# Patient Record
Sex: Female | Born: 1975 | Race: White | Hispanic: No | Marital: Married
Health system: Midwestern US, Academic
[De-identification: ages and names within clinical notes are randomized; demographics above are authoritative.]

## PROBLEM LIST (undated history)

## (undated) DIAGNOSIS — J340 Abscess, furuncle and carbuncle of nose: Secondary | ICD-10-CM

## (undated) DIAGNOSIS — R109 Unspecified abdominal pain: Secondary | ICD-10-CM

## (undated) DIAGNOSIS — H109 Unspecified conjunctivitis: Secondary | ICD-10-CM

## (undated) DIAGNOSIS — R011 Cardiac murmur, unspecified: Secondary | ICD-10-CM

## (undated) DIAGNOSIS — K429 Umbilical hernia without obstruction or gangrene: Secondary | ICD-10-CM

## (undated) DIAGNOSIS — M4310 Spondylolisthesis, site unspecified: Secondary | ICD-10-CM

## (undated) DIAGNOSIS — I499 Cardiac arrhythmia, unspecified: Secondary | ICD-10-CM

## (undated) DIAGNOSIS — R1110 Vomiting, unspecified: Secondary | ICD-10-CM

## (undated) DIAGNOSIS — R110 Nausea: Secondary | ICD-10-CM

## (undated) DIAGNOSIS — B373 Candidiasis of vulva and vagina: Secondary | ICD-10-CM

## (undated) DIAGNOSIS — M5430 Sciatica, unspecified side: Secondary | ICD-10-CM

## (undated) DIAGNOSIS — N390 Urinary tract infection, site not specified: Secondary | ICD-10-CM

## (undated) DIAGNOSIS — R059 Cough, unspecified: Secondary | ICD-10-CM

## (undated) DIAGNOSIS — L309 Dermatitis, unspecified: Secondary | ICD-10-CM

## (undated) DIAGNOSIS — F32A Depression, unspecified: Secondary | ICD-10-CM

## (undated) DIAGNOSIS — J029 Acute pharyngitis, unspecified: Secondary | ICD-10-CM

## (undated) DIAGNOSIS — F411 Generalized anxiety disorder: Secondary | ICD-10-CM

## (undated) DIAGNOSIS — F410 Panic disorder [episodic paroxysmal anxiety] without agoraphobia: Secondary | ICD-10-CM

## (undated) DIAGNOSIS — J329 Chronic sinusitis, unspecified: Secondary | ICD-10-CM

## (undated) DIAGNOSIS — J1282 Pneumonia due to coronavirus disease 2019: Secondary | ICD-10-CM

## (undated) DIAGNOSIS — H6090 Unspecified otitis externa, unspecified ear: Secondary | ICD-10-CM

## (undated) DIAGNOSIS — E039 Hypothyroidism, unspecified: Secondary | ICD-10-CM

## (undated) DIAGNOSIS — U071 COVID-19: Secondary | ICD-10-CM

## (undated) DIAGNOSIS — E271 Primary adrenocortical insufficiency: Secondary | ICD-10-CM

## (undated) DIAGNOSIS — H6690 Otitis media, unspecified, unspecified ear: Secondary | ICD-10-CM

## (undated) DIAGNOSIS — B3731 Acute candidiasis of vulva and vagina: Secondary | ICD-10-CM

## (undated) DIAGNOSIS — M6208 Separation of muscle (nontraumatic), other site: Secondary | ICD-10-CM

## (undated) DIAGNOSIS — G43909 Migraine, unspecified, not intractable, without status migrainosus: Secondary | ICD-10-CM

## (undated) HISTORY — DX: Sciatica, unspecified side: M54.30

## (undated) HISTORY — DX: Candidiasis of vulva and vagina: B37.3

## (undated) HISTORY — DX: Dermatitis, unspecified: L30.9

## (undated) HISTORY — DX: Urinary tract infection, site not specified: N39.0

## (undated) HISTORY — DX: Unspecified conjunctivitis: H10.9

## (undated) HISTORY — DX: Cardiac murmur, unspecified: R01.1

## (undated) HISTORY — DX: COVID-19: U07.1

## (undated) HISTORY — DX: Spondylolisthesis, site unspecified: M43.10

## (undated) HISTORY — DX: Pneumonia due to coronavirus disease 2019: J12.82

## (undated) HISTORY — DX: Depression, unspecified: F32.A

## (undated) HISTORY — DX: Nausea: R11.0

## (undated) HISTORY — DX: Otitis media, unspecified, unspecified ear: H66.90

## (undated) HISTORY — DX: Cardiac arrhythmia, unspecified: I49.9

## (undated) HISTORY — DX: Umbilical hernia without obstruction or gangrene: K42.9

## (undated) HISTORY — DX: Panic disorder (episodic paroxysmal anxiety): F41.0

## (undated) HISTORY — DX: Chronic sinusitis, unspecified: J32.9

## (undated) HISTORY — DX: Acute pharyngitis, unspecified: J02.9

## (undated) HISTORY — DX: Vomiting, unspecified: R11.10

## (undated) HISTORY — DX: Hypothyroidism, unspecified: E03.9

## (undated) HISTORY — DX: Migraine, unspecified, not intractable, without status migrainosus: G43.909

## (undated) HISTORY — DX: Acute candidiasis of vulva and vagina: B37.31

## (undated) HISTORY — DX: Primary adrenocortical insufficiency: E27.1

## (undated) HISTORY — DX: Unspecified abdominal pain: R10.9

## (undated) HISTORY — DX: Generalized anxiety disorder: F41.1

## (undated) HISTORY — DX: Separation of muscle (nontraumatic), other site: M62.08

## (undated) HISTORY — DX: Unspecified otitis externa, unspecified ear: H60.90

## (undated) HISTORY — DX: Abscess, furuncle and carbuncle of nose: J34.0

## (undated) HISTORY — DX: Cough, unspecified: R05.9

## (undated) LAB — EKG 12-LEAD
Atrial Rate: 96 {beats}/min
P Axis: 78 degrees
P-R Interval: 132 ms
Q-T Interval: 374 ms
QRS Duration: 84 ms
QTc Calculation (Bazett): 472 ms
R Axis: 86 degrees
T Axis: 67 degrees
Ventricular Rate: 96 {beats}/min

## (undated) NOTE — Telephone Encounter (Signed)
 Formatting of this note might be different from the original. Sent paper referral from [place] office  to [address] for Dr Tollie to review and sched for npv migraines.  Electronically signed by Jani Lapine at 02/14/2016 11:50 AM EST

## (undated) NOTE — Telephone Encounter (Signed)
 Formatting of this note might be different from the original. Referral:  Brittany Rogers's husband [name] is calling the office today and may be reached at:  [phone]  Requesting an appointment to neurology. Was referred to [facility] but now looking at [facility] because [facility] does not treat migraines.  Reason (Diagnosis/Symptom) for Appointment: poss seizure (?) & migraine Spouse is concerned that migraine medication might be causing the seizure-like symptoms Current Insurance:  [facility]  Patient has been advised to Fax the Referral to the Back Office Staff at:  [phone] (fax)  Once the Faxed Referral has been received, the patient is requesting a return call to Schedule.  Electronically signed by Grayce Maxwell at 02/10/2016  3:06 PM EST

## (undated) NOTE — Anesthesia Preprocedure Evaluation (Signed)
 Formatting of this note is different from the original. ANESTHESIA EVALUATION  I have reviewed the patient summary  and existing labs  .  (+) History of anesthetic complications (severe PONV): PONV . (-) No family history of anesthetic complications .  Review of Systems and Medical History:  CNS/Musculoskeletal:   (+)  Headaches: migraines. Psychiatric history: Anxiety . GI/Hepatic/Renal: negative ROS   Cardiovascular: negative ROS (+) Exercise tolerance: 7-9 Mets Endocrine/Other:  (+) thyroid  problem Hypothyroidism.   Respiratory:  neg pulmonary ROS Substance Use/Abuse:  negative ROS   Obstetrical Hx:     Obstetrics:        Non-Obstetrical Hx:       Airway: Mallampati: I TM distance: >3 FB Neck ROM: full    Dental: - no notable dental history .    Anesthesia Plan:  ASA: 2   Plan: General    Induction: Intravenous  NPO status verified Patient is appropriate for LMA Consent: - Anesthesia discussed major risks, alternatives, benefits, postop recovery process and destination.  Anesthesia plan understood and accepted.  All questions answered. The following person(s) agreed to procedure: patient      Electronically signed by Dasie Margit POUR, Registered Nurse at 12/14/2018  1:27 PM EST Electronically signed by Limmie Redell BIRCH., MD at 12/23/2018 12:25 PM EST Electronically signed by Limmie Redell BIRCH., MD at 12/23/2018  1:10 PM EST

## (undated) NOTE — Anesthesia Postprocedure Evaluation (Signed)
 Formatting of this note is different from the original. Anesthesia Post-op Note  Patient Name:  Brittany Rogers    Procedure(s): BILATERAL SALINE BREAST IMPLANT EXCHANGE TO SALINE IMPLANTS  Patient location: Phase II  I have personally evaluated the recovery of this patient from anesthesia:  Yes  Vital Signs:   Vitals:   12/23/18 1530  BP: 95/79   Vitals:   12/23/18 1530  Pulse: 62   Vitals:   12/23/18 1530  Temp: 37.1 C   Vitals:   12/23/18 1530  Resp: 19   Vitals:   12/23/18 1530  SpO2: 100%   Airway Patency: Excellent  Mental Status:  Alert and oriented  Pain Status:  Controlled  Nausea: None  Vomiting:  No  Hydration Status:  adequate    Perioperative Outcomes:  None   Additional Comments:    Electronically signed by Limmie Redell BIRCH., MD at 12/23/2018  3:37 PM EST

## (undated) NOTE — CAA (Care Area Assessment) (Signed)
 Formatting of this note is different from the original. Anesthesia OR Handoff Note  Patient Name:  Brittany Rogers    Procedure(s): BILATERAL SALINE BREAST IMPLANT EXCHANGE TO SALINE IMPLANTS  Pre-op diagnosis: Encounter for cosmetic procedure [Z41.1]  Surgeon/Service: Missy Katrine DASEN., MD, Plastics  Patient location: PACU  Anesthesia type: General  Medical history:  Past Medical History:  Diagnosis Date  ? Hx of migraines    Intra-procedure management issues/concerns: Uneventful    Post-op pain: Adequate analgesia  Post-op assessment: no apparent anesthetic complications, tolerated procedure well and no evidence of recall  Vitals:  Vitals:   12/23/18 1136  BP: 108/60  Pulse: 59  Resp: 20  Temp:   SpO2: 100%   Heart rate, blood pressure, respiratory rate, and oxygen saturation have been recorded and reviewed on arrival to PACU. Refer to nursing documentation.  Vitals status: stable  Post-op Airway: Room Air  Level of consciousness: awake  Complications: none  Plans for early post-procedure period: Progress to discharge from PACU  After opportunity for questions, ICU/PACU team accepts care of patient.   Receiving RN name: Rosaline  Additional Comments:   Electronically signed by Limmie Redell BIRCH., MD at 12/23/2018  2:34 PM EST

---

## 2008-06-16 ENCOUNTER — Inpatient Hospital Stay
Admit: 2008-06-16 | Discharge: 2008-06-16 | Disposition: A | Discharge status: Home / Self Care | Attending: Emergency Medicine

## 2008-06-16 MED ORDER — HYDROMORPHONE HCL 2 MG/ML IJ SOLN
2 MG/ML | Freq: Once | INTRAMUSCULAR | Status: AC
Start: 2008-06-16 — End: 2008-06-16
  Administered 2008-06-16: 18:00:00 2 mg via INTRAMUSCULAR

## 2008-06-16 MED ORDER — PROMETHAZINE HCL 25 MG PO TABS
25 MG | ORAL_TABLET | Freq: Four times a day (QID) | ORAL | Status: AC | PRN
Start: 2008-06-16 — End: 2008-06-23

## 2008-06-16 MED ORDER — PROMETHAZINE HCL 25 MG/ML IJ SOLN
25 MG/ML | Freq: Once | INTRAMUSCULAR | Status: AC
Start: 2008-06-16 — End: 2008-06-16
  Administered 2008-06-16: 18:00:00 25 mg via INTRAMUSCULAR

## 2008-06-16 MED ORDER — SODIUM CHLORIDE 0.9 % IV SOLN
0.9 % | Freq: Once | INTRAVENOUS | Status: DC
Start: 2008-06-16 — End: 2008-06-16
  Administered 2008-06-16: 19:00:00 1000 mL via INTRAVENOUS

## 2008-06-16 MED ORDER — KETOROLAC TROMETHAMINE 30 MG/ML IJ SOLN
30 | INTRAMUSCULAR | Status: AC
Start: 2008-06-16 — End: ?

## 2008-06-16 MED ORDER — SODIUM CHLORIDE 0.9 % IV SOLN
0.9 | INTRAVENOUS | Status: AC
Start: 2008-06-16 — End: ?

## 2008-06-16 MED ORDER — PROMETHAZINE HCL 25 MG/ML IJ SOLN
25 | INTRAMUSCULAR | Status: AC
Start: 2008-06-16 — End: ?

## 2008-06-16 MED ORDER — HYDROMORPHONE HCL 2 MG/ML IJ SOLN
2 | INTRAMUSCULAR | Status: AC
Start: 2008-06-16 — End: ?

## 2008-06-16 MED ORDER — KETOROLAC TROMETHAMINE 30 MG/ML IJ SOLN
30 MG/ML | Freq: Once | INTRAMUSCULAR | Status: AC
Start: 2008-06-16 — End: 2008-06-16
  Administered 2008-06-16: 18:00:00 60 mg via INTRAMUSCULAR

## 2008-06-16 MED FILL — KETOROLAC TROMETHAMINE 30 MG/ML IJ SOLN: 30 MG/ML | INTRAMUSCULAR | Qty: 2

## 2008-06-16 MED FILL — PROMETHAZINE HCL 25 MG/ML IJ SOLN: 25 MG/ML | INTRAMUSCULAR | Qty: 1

## 2008-06-16 MED FILL — SODIUM CHLORIDE 0.9 % IV SOLN: 0.9 % | INTRAVENOUS | Qty: 1000

## 2008-06-16 MED FILL — HYDROMORPHONE HCL 2 MG/ML IJ SOLN: 2 MG/ML | INTRAMUSCULAR | Qty: 1

## 2008-06-16 NOTE — ED Notes (Signed)
Bed:08-01<BR> Expected date:<BR> Expected time:<BR> Means of arrival:<BR> Comments:<BR>

## 2008-06-16 NOTE — Discharge Instructions (Signed)
Acute Headache   WHAT YOU SHOULD KNOW:   A headache is pain or discomfort in the head. A headache may have a known cause, or the cause may be unknown. Many people get headaches. Both men and women can get headaches, but women more often have headaches that are moderately or very painful. Headaches occur most often in middle aged adults, and less often in older adults.  AFTER YOU LEAVE:   Common types of headaches:    Tension-type headache: This is the most common type of headache. These headaches may occur during the day, typically in the late afternoon, and may go away by the evening. The pain is usually mild or moderate, and you may have problems tolerating bright light or loud noise. The pain is usually felt across the forehead or in the back of the head, and it may be only on one side. These headaches may occur every day.      Migraine headache: Migraine headaches are usually recurring and cause moderate or severe pain. The headaches generally last from 1 to 3 days. Pain tends to be on only one side of the head, and may change sides. The pain is often located in the temple or the back of the head, and it may feel throbbing. The pain may also be behind your eye, and feel sharp and steady.     Migraine with aura: An aura is something that you see or feel, and occurs before a headache. People with an aura before their migraine headache often see a small spot surrounded by bright zigzag lines. Other signs or symptoms may follow the aura.      Cluster headache: The pain of a cluster headache is usually on one side of the head. It often causes severe pain, and can last for 30 minutes to two hours. These headaches may occur once or twice each day. These headaches occur more often at night, and may wake you from sleeping.   Causes: Headaches may be a symptom of a wide variety of conditions including the following:    Infections: This includes infections caused by a virus, bacteria or other germ.     Metabolic  disorders: This includes thyroid gland and other disorders.      Neoplasms: These are also called growths or tumors.     Posttraumatic disorders: After head trauma, such as a blow to the head, a headache may occur.     Primary headache disorders: These problems may be from muscle contractions or changes in the way that your body feels, controls, or responds to pain.     Structural disorders: These problems may involve nerves such as cranial nerves or the trigeminal nerve. Meninges, blood vessels, muscles, soft tissues, skin, bones, or joints may also cause headaches. Tooth, neck or sinus pain may also cause headaches.     Toxins: Being around strong-smelling chemicals or other substances may cause headaches.   Other factors that may cause or be related to headaches include:    Caffeine: If you are used to drinking 2 to 3 cups of coffee each day, suddenly stopping may cause a headache.      Diet: Cured meats, artificial sweeteners, products that contain tyramine (such as red wine, aged cheese, and dark chocolate), and monosodium glutamate (MSG) may trigger headaches.     Drinking alcohol: Drinks that contain alcohol such as beer, wine, vodka and other hard liquor may cause headaches.     Fasting: Not eating for a period of   time may cause a headache.      Fatigue: Tiredness can cause tension-type headaches to occur.      Menstruation cycles: Menstruation may cause headaches, especially for teenagers or after a pregnancy. Headaches are also more likely when you use oral contraceptives (birth control pills) or estrogen replacement therapy.     Sleep deprivation: Not getting enough sleep can cause tension-type headaches, and changes in your usual sleep pattern can cause a migraine headache. Napping during the daytime may also cause headaches.     Stress: Tension or stress may cause headaches. Migraine headaches caused by stress may go away after relaxing. Headaches caused by stress may occur hours or even  days after stressful events.   Telling caregivers about your headache:    A pain scale may be used to help you tell caregivers how bad the pain is.             Headaches may be acute, lasting for hours or days. Headaches may also be subacute, lasting for days or weeks. Headaches may also be chronic, lasting for months or years. Headaches can occur in one or more places, anywhere in the head.     Headaches may be mild or moderately painful. It may be hard to describe exactly where the pain is, and the headache may feel like pressure in or on your head. These headaches are usually caused by muscle problems such as contractions (cramps) or injuries.     Headaches may be moderately or very painful. You may be able to state exactly where the pain is. These headaches usually are behind your eye or in your forehead, and are a throbbing pain. This type of headache is usually caused by problems with blood vessels in the head.   Signs and symptoms that may be related to headaches:    Fever.     Loss of memory, responses to events that seem incorrect, and trouble solving easy mathematical questions.      Nausea (feeling sick to your stomach) and vomiting (throwing up).     Problems with your vision:     Drooping eyelids, watery eyes, or red eyes.      Losing your ability to see to the sides, up or down.     Seeing strange designs or sights right before the headache occurs.     Trouble tolerating bright light.      Unable to see as clearly or as well as usual.      Runny nose.      Stiff neck.     Tenderness of the head and neck area.      Trouble staying awake, or being less alert than usual.      Weakness or decreased energy.   Treatment:    Treatment for a headache depends on the cause of the headache, if the cause is known. If there is a medical condition causing your headache, treatment will be aimed at that condition. If the cause of your headache is unknown, treatment is aimed at stopping or preventing  the headache.     Treatment to stop the headache may be done for headaches that cause moderate or severe pain. OTC pain medicine may be suggested for treatment of occasional tension-type headaches. If headaches greatly impact your daily activities, certain treatments do not help, and for other factors, you may need one or more of the following treatments to prevent or treat a headache:     Analgesics: This medicine may be   used to treat tension-type headaches.      Antidepressants: This medicine may help decrease how often and how long you have tension-type headaches.     Biofeedback: Biofeedback is a special way to control how your body reacts to things like stress or pain. The first step in this training is to use electrodes (wires) to monitor your body responses. These electrodes are placed on different parts of your body, such as your chest. The electrodes are attached to a TV-type monitor which gives a paper tracing of your heart beating. You will learn how to control body changes, such as slowing your heart rate, when you become upset. Thermal biofeedback measures heat. When this is used with relaxation, certain headaches may be prevented. Electromyographic biofeedback measures muscle tension. This treatment may be used with other treatments to prevent headaches.     Cognitive - behavior therapy: Also called stress management, this therapy may be used with other measures to prevent headaches.   Self-care:    Applying heat to the area increases blood circulation and may relieve a headache. Place a warm compress or a heating pad on the painful area. A warm compress is a small towel dampened with hot water and placed in a plastic bag. Wrap a towel around the plastic bag to prevent burns. Keep a heating pad on a low heat setting. Do not sleep on a heating pad because it can cause a bad burn.     You may want to keep a headache diary. Record the dates and times that you get headaches, and what you were doing  before the headache started. This might help you learn if there is something that triggers your headaches.     Try lying down in a comfortable position and closing your eyes. Relax your muscles slowly, starting at the toes and working your way up your body.   Appointments with other caregivers:    You may need to see a dentist for a temporomandibular joint condition (TMJ). If you are an older adult and temporal arteritis is suspected, you may need a procedure called a temporal artery biopsy. You may need to see an ophthalmologist if headaches occur after close-up work such as reading, or if your headaches may be caused by another condition related to your eyesight.     You may need to see a psychiatrist if you have anxiety or depression, or a gynecologist if you use birth control pills or estrogen replacement therapy. If sudden and serious conditions that may require surgery are suspected, you may need to see a neurosurgeon. You may need to see a neurologist if you have other symptoms with your headache, or caregivers find problems during an exam. If you have long-lasting or long-term headaches or treatment does not work to prevent or relieve your headaches, you may need to see a headache specialist.   What to expect with time and treatment: Relief from headaches depends on the type of headache, treatments used, and other factors. Migraine headaches may improve after age 50. Headaches that occur in relation to the menstrual cycle may be less likely to improve over time. Headaches caused by conditions such as temporal arteritis and subdural hematoma may slowly improve. There may be lasting symptoms such as vision loss or memory loss if headaches are caused by a subdural hematoma or other medical condition.   CONTACT A CAREGIVER IF:    You have a continuous headache with vomiting and signs of dehydration.     You   have a daily headache with pain that is not relieved by medicine and other treatments.      You have  changes in your headaches, or new symptoms that occur when you have a headache.     You have headaches after having surgery, or after being told that you have another medical condition.     You have severe headache pain that does not go away even after using medicine.   SEEK CARE IMMEDIATELY IF:    You have a headache that occurs after a blow to the head, a fall, or other trauma.      You have a severe headache, neck stiffness, vomiting and photophobia (pain with bright light).      You have changes in personality, forgetfulness, or other mental changes with your headache.     You have loss of feeling on one side of your face or body.     You have unbearable headache pain.   Copyright  2009. [employer]. All rights reserved. Information is for End User's use only and may not be sold, redistributed or otherwise used for commercial purposes.  The above information is an educational aid only. It is not intended as medical advice for individual conditions or treatments. Talk to your doctor, nurse or pharmacist before following any medical regimen to see if it is safe and effective for you.

## 2008-06-16 NOTE — ED Provider Notes (Signed)
HPI  Is a 33 year old female presents with typical migraine headache onset last night.  She's been having these headaches ever since the age of 64.  His headache is typical for her.  It is not the worst of her life.  It is not sudden in onset.  He currently is left-sided.  It is associated with photophobia phonophobia nausea and vomiting.  No complaints of fevers or chills.this is not the worst headache of her life.  It is not sudden in onset.  Review of Systems  Negative fevers chills positive nausea positive vomiting patient currently postpartum and breast-feeding.  Negative abdominal pain positive headache positive photophobia positive nausea  Remainder of ROS reviewed and Negative.          Nursing notes reviewed and agree with above.      Physical Exam  BP 102/62   Pulse 79   Temp(Src) 97.1 ??F (36.2 ??C) (Oral)   Resp 16   Ht 5\' 2"  (1.575 m)   Wt 130 lb (58.968 kg)   SpO2 100%   LMP 07/31/2007  Patient appears comfortable, age appropriate.  Interacts with the examiner in a normal age appropriate fashion.  Pupils round reactive to light, nonicteric.  Clear speech, moist mucous membranes, tongue midline. No elevation of floor of mouth.  No significant neck adenopathy, trachea midline, no obvious thyromegaly.  Neck supple, no signs of meningismus.  Lungs clear bilaterally. No wheezes, crackles, or rhonchi.  Heart regular rate and rhythm. No murmurs, gallops, or rubs.  Abdomen soft, nontender, nondistended. No hepatosplenomegaly. No pulsatile masses.  Abdomen without rebound, guarding, or signs of peritonitis.  Nontender at Mcburney's point. No Murphy sign.  Skin without rashes or any other lesions.   Moves all 4 extremities purposefully.  No back tenderness.  Patient alert and oriented.  GCS  15.  CN II-XII intact.  Clear speech.  Visual fields intact by confrontation.  Finger to nose intact bilaterally.  Normal Gait.        Procedures      MDM  Patient with a typical migraine headache for her.  It is not worst  of her life.  It is not sudden in onset.  Suspicion of acute bleed or infection is low.  Plan supportive care and follow-up.    Labs      Radiology      EKG Interpretation

## 2008-06-17 ENCOUNTER — Inpatient Hospital Stay
Admit: 2008-06-17 | Discharge: 2008-06-17 | Disposition: A | Discharge status: Home / Self Care | Attending: Emergency Medicine

## 2008-06-17 MED ORDER — KETOROLAC TROMETHAMINE 30 MG/ML IJ SOLN
30 MG/ML | Freq: Once | INTRAMUSCULAR | Status: AC
Start: 2008-06-17 — End: 2008-06-17
  Administered 2008-06-17: 13:00:00 30 mg via INTRAVENOUS

## 2008-06-17 MED ORDER — HYDROMORPHONE HCL 1 MG/ML IJ SOLN
1 MG/ML | Freq: Once | INTRAMUSCULAR | Status: AC
Start: 2008-06-17 — End: 2008-06-17
  Administered 2008-06-17: 13:00:00 1 mg via INTRAVENOUS

## 2008-06-17 MED ORDER — ONDANSETRON HCL 2 MG/ML IV SOLN
2 MG/ML | Freq: Once | INTRAVENOUS | Status: AC
Start: 2008-06-17 — End: 2008-06-17
  Administered 2008-06-17: 13:00:00 4 mg via INTRAVENOUS

## 2008-06-17 MED ORDER — HYDROMORPHONE HCL 1 MG/ML IJ SOLN
1 | INTRAMUSCULAR | Status: AC
Start: 2008-06-17 — End: ?

## 2008-06-17 MED ORDER — KETOROLAC TROMETHAMINE 30 MG/ML IJ SOLN
30 | INTRAMUSCULAR | Status: AC
Start: 2008-06-17 — End: ?

## 2008-06-17 MED ORDER — SODIUM CHLORIDE 0.9 % IV SOLN
0.9 % | Freq: Once | INTRAVENOUS | Status: DC
Start: 2008-06-17 — End: 2008-06-17

## 2008-06-17 MED ORDER — HYDROMORPHONE HCL 1 MG/ML IJ SOLN
1 MG/ML | Freq: Once | INTRAMUSCULAR | Status: AC
Start: 2008-06-17 — End: 2008-06-17
  Administered 2008-06-17: 14:00:00 1 mg via INTRAVENOUS

## 2008-06-17 MED ORDER — SODIUM CHLORIDE 0.9 % IV SOLN
0.9 | INTRAVENOUS | Status: AC
Start: 2008-06-17 — End: ?

## 2008-06-17 MED ORDER — ONDANSETRON HCL 2 MG/ML IV SOLN
2 | INTRAVENOUS | Status: AC
Start: 2008-06-17 — End: ?

## 2008-06-17 MED FILL — HYDROMORPHONE HCL 1 MG/ML IJ SOLN: 1 MG/ML | INTRAMUSCULAR | Qty: 1

## 2008-06-17 MED FILL — SODIUM CHLORIDE 0.9 % IV SOLN: 0.9 % | INTRAVENOUS | Qty: 1000

## 2008-06-17 MED FILL — ONDANSETRON HCL 4 MG/2ML IV SOLN: 4 MG/2ML | INTRAVENOUS | Qty: 2

## 2008-06-17 MED FILL — KETOROLAC TROMETHAMINE 30 MG/ML IJ SOLN: 30 MG/ML | INTRAMUSCULAR | Qty: 1

## 2008-06-17 NOTE — ED Notes (Signed)
Reports relief from headache but has residual pain and tightness in neck and this is reported to dr Judithann Graves

## 2008-06-17 NOTE — Discharge Instructions (Signed)
Migraine Headache   WHAT YOU SHOULD KNOW:   Migraines are bad, throbbing headaches. Many people also have nausea (upset stomach) and vomiting (throwing up) with a migraine. Migraines last from a few hours to several days. These headaches can happen many times a month or only once in a while. Treatment includes headache medicine and avoiding things that trigger your migraines.  AFTER YOU LEAVE:   Medicines:    Medicines may be taken to prevent migraines, or to stop them once they start. Some over-the-counter medicines, such as aspirin, acetaminophen (a-seet-a-MIN-oh-fen) or ibuprofen (eye-bu-PROH-fen), may help your pain. Some medicines can help your other migraine symptoms, such as anti-nausea medicines.     Learn the warning signs of your migraines so you can take medicine as early as possible. Some medicines may not work as well at controlling your pain if you wait too long to take it. If you are taking medicine that makes you drowsy, do not drive or use heavy equipment.     Keep a written list of the medicines you take, the amounts, and when and why you take them. Bring the list of your medicines or the pill bottles when you see your caregivers. Learn why you take each medicine. Ask your caregiver for information about your medicine. Do not take any medicines, over-the-counter drugs, vitamins, herbs, or food supplements without first talking to caregivers.     Always take your medicine as directed by caregivers. If you use some medicines too often, you may get a "rebound" migraine or have other problems. Call your caregiver if you think your medicines are not helping or if you feel you are having side effects. Remember that some medicines may take several weeks before they start helping. Do not quit taking your everyday medicines until you discuss it with your caregiver.   When is my next doctor's appointment?   Ask your caregiver when to return for a follow-up visit. Keep all appointments. Write down any  questions you may have. This way you will remember to ask these questions during your next visit.  Bring your headache diary with you when you see your caregiver.   How can I prevent or treat my migraines? Medicines are just one of the ways to prevent and treat migraines. Here are some other things you can do to help your migraines:    Avoid smoke and alcohol: It is never too late to quit smoking. Besides triggering migraines, smoking increases your chance of having a heart attack, lung disease, and cancer. Alcohol can react badly with many of the medicines used to treat migraines. Alcohol may also trigger migraines.      Exercise: Exercise may help prevent migraines. Begin a regular exercise program. Talk to your caregiver before you start exercising. Together, you and your caregiver can plan the best exercise program for you. It is best to start slowly and do more as you get stronger. Exercising also makes the heart stronger, lowers blood pressure, and keeps you healthy.     Keep a headache diary: Use a diary or calendar to keep track of your migraines. Write down when your headaches start and stop, what you were doing when they started, and your symptoms. Record anything you ate or drank during the 24 hours before the headaches. Describe how the pain feels, where it is, and how bad it is. Keep track of the things you did to help your headaches and when you did them. Make a note about whether these things   helped your symptoms or not. This record will help you learn what may trigger your headaches and what helps them the most.      Avoid triggers: Things that trigger migraines are different from person to person. Some things that often trigger migraines include:     Bright or flashing lights, loud noises, or strong smells (such as chemical fumes).     Certain foods or drinks like chocolate, hard cheese, red wine, or other alcoholic drinks. Things in foods like nitrates or gluten may also cause migraines.  Nitrates are found in many processed meats, such as bacon, hot dogs, and deli meats. Gluten is a protein found in wheat and other grains. Things added to foods, such as MSG or artificial sweeteners, may cause a migraine to start. Caffeine, which is often used to treat migraines, can also trigger them.     Eye strain.     Oversleeping, or not getting enough sleep.     Skipping meals or going too long without eating.     Smoking or being around smoke.      Heat and cold: Some people find that heat or cold applied to the headache area can ease migraine pain. If heat helps you, use a heating pad (turned on low), a hot water bottle, or warm shower. Do not sleep on the heating pad or hot water bottle. This can cause a bad burn. You can also try cold packs to decrease your migraine pain. Put ice in a plastic bag and cover it with a towel. Place this over the painful area for 20 minutes out of every hour, for as long as you need it. Do not put the ice pack directly on the skin because you can get frostbite.     Special medical care: If you are having trouble controlling your migraines, you may need to see a special caregiver. Some caregivers, such as neurologists (nu-ROL-oh-jists) or those at pain clinics, specialize in treating migraines.      Stress and rest: Avoid or control stress as much as you can. Learn new ways to relax, such as deep breathing, meditation (med-i-TAY-shun), relaxing your muscles, music, or biofeedback. Ask your caregiver for more information about any of these. Talk to someone about things that upset you. Get plenty of rest. Try to go to bed and wake up at the same time each day. During a migraine, rest in a dark, quiet room.   For support or more information: Some people find it helpful to talk with others who are also living with migraine headaches. Support groups and national organizations can help you learn more about migraines. Ask your caregiver for a support group in your area, or  contact the following organizations:    National [employer]  [address]  [place] [address]  Phone: 1-888-NHF-5552  Web Address: http://www.headaches.org   National Institute of Neurological [employer] and Stroke  [address]  [place] [address]  Phone: 1-800-352-9424  Web Address: http://www.ninds.nih.gov  CONTACT A CAREGIVER IF:    You have a fever (increased body temperature).      Your migraines happen so often that they affect your ability to do your work or normal activities.     Medicines or treatments that were helping your migraines are no longer helping.   SEEK CARE IMMEDIATELY IF:    You have a headache that seems different or much worse than your usual migraine headache.     Your headache is severe   and you also have a fever and a stiff neck.     You have new problems with speech, vision, balance, or movement.     You lose consciousness (pass out), become confused, or have a seizure.   Copyright  2009. [employer]. All rights reserved. Information is for End User's use only and may not be sold, redistributed or otherwise used for commercial purposes.  The above information is an educational aid only. It is not intended as medical advice for individual conditions or treatments. Talk to your doctor, nurse or pharmacist before following any medical regimen to see if it is safe and effective for you.

## 2008-06-17 NOTE — ED Provider Notes (Signed)
HPI  Chief Complaint:  Chief Complaint   Patient presents with   ??? Migraine     Pt with migraine; was seen in this ER yesterday for same; reports no relief.         Nursing Notes, Past Medical HX, Past Surgical HX, Allergies, and Family Hx were all reviewed.    HPI:    This is a  33 y.o. female  With past history of migraine headaches.  States she has another bad migraine.  It was present yesterday and she came to the emergency department for treatment.  Yesterday, and the pain is nearly gone, but it recurred again today.  He has left-sided temporal headache, photophobia, nausea, vomiting yesterday, but not today.  She thought she would come in before it starts causing nausea and vomiting at this time.  This is not the worse headache of her life.  She has no stiff neck.  No fever.  She has positive family history of migraine headache.  Electrolytes seemed to make it worse.  Nothing seems to make it better at this time.  She is taking Percocet and Phenergan at home, which are not adequate for her at home at this time.  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    Review of Systems   Constitutional: Negative.    Eyes: Negative.    Respiratory: Negative.    Cardiovascular: Negative.    Gastrointestinal: Positive for nausea.   Genitourinary: Negative.    Musculoskeletal: Negative.    Neurological: Positive for headaches.   All other systems reviewed and are negative.          Physical Exam   Nursing note and vitals reviewed.  Constitutional: She is oriented to person, place, and time. She appears well-developed. She appears distressed (mild distress due to pain).   HENT:   Head: Normocephalic and atraumatic.   Eyes: Extraocular motions are normal. Pupils are equal, round, and reactive to light.   Neck: Normal range of motion. Neck supple.   Cardiovascular: Normal rate, regular rhythm and normal heart sounds.    Pulmonary/Chest: Effort normal and breath  sounds normal. She has no wheezes. She has no rales.   Abdominal: Soft. She exhibits no mass. No tenderness.   Musculoskeletal: She exhibits no edema.   Neurological: She is alert and oriented to person, place, and time.   Skin: Skin is warm and dry.   Psychiatric: She has a normal mood and affect. Her behavior is normal.         Procedures      MDM    patient with history of migraine headaches.  She has had significant relief while here.  I will give her another half dose of Dilantin, and she will be allowed to be discharged.  She follow-up with her primary care physician.  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  There is no suspicion at this time for subarachnoid hemorrhage.

## 2008-09-29 NOTE — Unmapped (Signed)
[facility]     PATIENT NAME:   Brandy Roy, Brandy Roy                    MR #:  [id number]   DATE OF BIRTH:  [birth year]                        ACCOUNT #:  [id number]   ED PHYSICIAN:   Frederik Schmidt, M.D.       ROOM #:   PRIMARY:        [facility]                       NURSING UNIT:  ED   REFERRING:                                        FC:  C   DICTATED BY:    Evelene Croon, P.A.              ADMIT DATE:  09/29/2008   VISIT DATE:                                       DISCHARGE DATE:                           EMERGENCY DEPARTMENT DISCHARGE NOTE     *-*-*     CHIEF COMPLAINT:  Headache and vomiting.     HISTORY OF PRESENT ILLNESS:  The patient is a 33 year old female with a   history of migraines who presents to the emergency room with a migraine.  She   says the symptoms today are fairly typical for her.  She tried to take   triptan with no relief of symptoms.  She has a left-sided headache, some   nausea.  No fevers, chills.  No neck pain.  No recent injury or trauma.  It   is not the worse headache of her life.  There is no thunderclap presentation.     PAST MEDICAL HISTORY:     1.  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     SOCIAL HISTORY:  She uses alcohol occasionally.  No tobacco or illicit drugs.     MEDICATIONS:  See ED notes.     ALLERGIES:  None.     REVIEW OF SYSTEMS:  As mentioned, otherwise negative.     PHYSICAL EXAMINATION:     VITAL SIGNS:  Blood pressure 102/56, pulse 99, respirations 18, temperature   97.6, O2 sat 100% on room air.   GENERAL:  She is awake, alert and oriented, in no acute distress.   CHEST:  Clear breath sounds bilaterally with no wheezes, rales, or rhonchi.   HEART:  Regular rate and rhythm with no murmurs, rubs, or gallops.   ABDOMEN:  Soft, nontender, nondistended, good bowel sounds with no   organomegaly.   EXTREMITIES:  No clubbing, cyanosis, or edema.   NEUROLOGIC:  Cranial nerves are intact.  Reflexes are normal.      EMERGENCY DEPARTMENT COURSE:  This young lady is seen and evaluated in the   emergency room for a migraine headache.  Here, she was given a liter of   fluids,  Phenergan 12.5 mg IV, Dilaudid 1 mg IV.  This did give good relief of   symptoms.  She will be discharged home in stable condition to follow up with   her primary care physician and her specialist.     DIAGNOSIS:     1.  Cephalgia.     DISPOSITION:  The patient is discharged from the emergency room in stable   condition.     The patient was seen by Dr. Clarene Critchley, who agrees with assessment and plan.       *-*-*                                             _______________________________________   RA/jf                                  _____   D:  09/29/2008 13:05                  Evelene Croon, P.A.   T:  09/29/2008 18:35   Job #:  [id number]                                         _______________________________________                                          _____                                         Frederik Schmidt, M.D.                             EMERGENCY DEPARTMENT DISCHARGE NOTE                                                                PAGE    1 of   1                                         Frederik Schmidt, M.D.                             EMERGENCY DEPARTMENT DISCHARGE NOTE                                                                PAGE    1 of   1

## 2008-09-29 NOTE — Unmapped (Signed)
[facility]     PATIENT NAME:   Brandy Roy, Brandy Roy                    MR #:  [id number]   DATE OF BIRTH:  [birth year]                        ACCOUNT #:  [id number]   ED PHYSICIAN:   Frederik Schmidt, M.D.       ROOM #:   PRIMARY:        Son M. Toni Arthurs                       NURSING UNIT:  ED   REFERRING:                                        FC:  C   DICTATED BY:    Frederik Schmidt, M.D.       ADMIT DATE:  09/29/2008   VISIT DATE:     09/29/2008                        DISCHARGE DATE:                           EMERGENCY DEPARTMENT DISCHARGE NOTE     *-*-*     ***ADDENDUM -- 09/29/2008 -- mkjf***     I have seen and evaluated the patient, discussed the patient with the PA and   I agree with the assessment and plan.       *-*-*                                             _______________________________________   BAS/jf                                 _____   D:  09/29/2008 13:06                  Frederik Schmidt, M.D.   T:  09/29/2008 18:35   Job #:  [id number]                               EMERGENCY DEPARTMENT DISCHARGE NOTE                                                                PAGE    1 of   1                                                                PAGE    1 of  1

## 2009-04-16 NOTE — Telephone Encounter (Signed)
Formatting of this note might be different from the original.  [facility] told her that if adain's fever went to high that you told her to take him there so she  Can get the bill written off.  Electronically signed by Vivianne Master D at 04/16/2009  3:28 PM EDT

## 2009-04-18 NOTE — Telephone Encounter (Signed)
Formatting of this note might be different from the original.  Brandy Roy,    Would you please write the letter for me ?    SMB  Electronically signed by Estevan Oaks, MD at 04/18/2009 12:39 PM EDT

## 2009-04-26 NOTE — Unmapped (Signed)
[facility]     PATIENT NAME:   Brandy Roy, Brandy Roy                    MR #:  [id number]   DATE OF BIRTH:  1975-06-09                        ACCOUNT #:  [id number]   ED PHYSICIAN:   Cherly Anderson, M.D.                 ROOM #:   PRIMARY:        Son M. Toni Arthurs                       NURSING UNIT:  ED   REFERRING:                                        FC:  C   DICTATED BY:    Cherly Anderson, M.D.                 ADMIT DATE:  04/25/2009   VISIT DATE:     04/25/2009                        DISCHARGE DATE:                               EMERGENCY DEPARTMENT NOTE     *-*-*     TIME OF INITIAL EVALUATION:  0830 hours.     FINAL IMPRESSION:     1. Cephalgia with migraine headache.     HISTORY OF PRESENT ILLNESS:  The patient is a 34 year old patient, who   presented to the emergency department here with migraine headache.  The   patient denies any prior dizziness, lightheadedness, passing out, and   blacking out.  Denies any fevers or chills.  Denies any problem with   shortness of breath or difficulty with breathing.     PAST MEDICAL HISTORY:     1. Migraine headache.     PAST SURGICAL HISTORY:     1. Brain biopsy in the past.     SOCIAL HISTORY:  Denies smoking, alcohol or drugs of abuse.     ALLERGIES:     1. Latex.     FAMILY HISTORY:  Negative for diabetes, coronary artery disease.     REVIEW OF SYSTEMS:  All other review of systems as it pertains to patient's   history of present illness and chief complaint are negative and   noncontributory.     PHYSICAL EXAMINATION:     VITAL SIGNS:  Blood pressure 101/64, pulse 68, respiratory rate 20, and   temperature 97.   GENERAL:  The patient is well-developed, well-nourished patient, awake,   alert, lucid, and cooperative with examination, in no acute distress.   HEENT:  TMs are clear.  Nares patent.   NECK:  Supple.  Trachea is midline.   HEART:  Regular rate and rhythm.   LUNGS:  Clear to auscultation.   ABDOMEN:  Soft, nontender, and nondistended.    SKIN:  The patient had no rash, no exudates are noted.   NEUROLOGIC:  Motor, sensory, cerebellar, cranial nerves II-XII are tested and   intact.  She is in  no acute distress.   BACK:  The patient had no C-spine, T-spine or L-spine tenderness noted here.     EMERGENCY DEPARTMENT COURSE:  The patient had IV line established, for pain   the patient was given Toradol, Compazine and Benadryl.  She was observed for   a short period of time, had recurrent headache symptoms.  For that reason,   given droperidol and then for breakthrough pain Dilaudid.  The patient on   arrival presented asking for Dilaudid and Phenergan by name on presentation   for her migraine headache.  She was explained that because of rebound effects   of Dilaudid, I was concerned for possible rebound headache and I would prefer   to treat her as a migraine initially with initial presentation of Toradol,   Compazine, and Benadryl.  She had escalated and for that reason was given   Dilaudid.  Her electrolyte panel here is normal.  Her white count is normal.     DISPOSITION:  She will be discharged home.     PLAN:     1. Outpatient followup care and treatment as stated above for migraine     headache.   2. She will be discharged home on Phenergan as needed for nausea.   3. I will continue her current Fioricet as needed for headache.       *-*-*                                             _______________________________________   AW/cm                                  _____   D:  04/25/2009 10:45                  Cherly Anderson, M.D.   T:  04/26/2009 07:34   Job #:  [id number]                                    EMERGENCY DEPARTMENT NOTE                                                                PAGE    1 of   1                                                                PAGE    1 of   1

## 2010-02-28 ENCOUNTER — Inpatient Hospital Stay
Admit: 2010-02-28 | Discharge: 2010-02-28 | Disposition: A | Discharge status: Home / Self Care | Attending: Emergency Medicine

## 2010-02-28 LAB — HEPATIC FUNCTION PANEL
ALT: 26 U/L (ref 10–40)
AST: 26 U/L (ref 15–37)
Albumin: 4.5 gm/dl (ref 3.4–5.0)
Alkaline Phosphatase: 48 U/L (ref 45–129)
Bilirubin, Direct: 0.45 mg/dl — ABNORMAL HIGH (ref 0.0–0.3)
Bilirubin, Indirect: 1.35 mg/dl — ABNORMAL HIGH (ref 0.0–1.0)
Total Bilirubin: 1.8 mg/dl — ABNORMAL HIGH (ref 0.0–1.0)
Total Protein: 8 gm/dl (ref 6.4–8.2)

## 2010-02-28 LAB — POC URINALYSIS
Bilirubin, UA: NEGATIVE
Blood, UA POC: NEGATIVE
Glucose, UA POC: NEGATIVE
Ketones, UA: NEGATIVE
Leukocytes, UA: NEGATIVE
Nitrite, UA: NEGATIVE
Spec Grav, UA: 1.03
Urobilinogen, UA: 0.2
pH, UA: 5.5

## 2010-02-28 LAB — BASIC METABOLIC PANEL
Anion Gap: 4.2
BUN: 11 mg/dl (ref 7–18)
CO2: 27 mEq/L (ref 21–32)
Calcium: 9.5 mg/dl (ref 8.3–10.6)
Chloride: 107 mEq/L (ref 99–110)
Creatinine: 0.9 mg/dl (ref 0.6–1.1)
GFR Est, African/Amer: >60
GFR, Estimated: >60 (ref >60)
Glucose: 104 mg/dl — ABNORMAL HIGH (ref 70–99)
Potassium: 3.6 mEq/L (ref 3.5–5.1)
Sodium: 138 mEq/L (ref 136–145)

## 2010-02-28 LAB — CBC WITH AUTO DIFFERENTIAL
Basophils %: 0 % (ref 0.0–2.0)
Basophils Absolute: 0 10*3 (ref 0.0–0.2)
Eosinophils %: 0.2 % (ref 0.0–5.0)
Eosinophils Absolute: 0 10*3 (ref 0.0–0.6)
Granulocyte Absolute Count: 4.9 10*3 (ref 1.7–7.7)
Hematocrit: 45.8 % (ref 36.0–48.0)
Hemoglobin: 15.6 gm/dl (ref 12.0–16.0)
Lymphocytes %: 4.7 % — ABNORMAL LOW (ref 25.0–40.0)
Lymphocytes Absolute: 0.2 10*3 — ABNORMAL LOW (ref 1.0–5.1)
MCH: 30.3 pg (ref 26–34)
MCHC: 34.2 gm/dl (ref 31–36)
MCV: 88.7 fl (ref 80–100)
MPV: 8.7 fl (ref 5.0–10.5)
Monocytes %: 1.2 % (ref 0.0–12.0)
Monocytes Absolute: 0.1 10*3 (ref 0.0–1.3)
Platelets: 188 10*3 (ref 135–450)
RBC: 5.16 10*6 (ref 4.0–5.2)
RDW: 13.8 % (ref 11.5–14.5)
Segs Relative: 93.9 % — ABNORMAL HIGH (ref 42.0–63.0)
WBC: 5.2 10*3 (ref 4.0–11.0)

## 2010-02-28 LAB — LIPASE: Lipase: 46 U/L (ref 5.6–51.3)

## 2010-02-28 LAB — POC PREGNANCY UR-QUAL: Preg Test, Ur: NEGATIVE

## 2010-02-28 MED ORDER — MORPHINE SULFATE 2 MG/ML IJ SOLN
2 MG/ML | Freq: Once | INTRAMUSCULAR | Status: AC
Start: 2010-02-28 — End: 2010-02-28
  Administered 2010-02-28: 19:00:00 via INTRAVENOUS

## 2010-02-28 MED ORDER — ONDANSETRON 4 MG PO TBDP
4 MG | Freq: Once | ORAL | Status: AC
Start: 2010-02-28 — End: 2010-02-28
  Administered 2010-02-28: 16:00:00 via ORAL

## 2010-02-28 MED ORDER — KETOROLAC TROMETHAMINE 60 MG/2ML IJ SOLN
60 | INTRAMUSCULAR | Status: AC
Start: 2010-02-28 — End: 2010-02-28
  Administered 2010-02-28: 17:00:00 via INTRAVENOUS

## 2010-02-28 MED ORDER — ONDANSETRON 4 MG PO TBDP
4 MG | ORAL_TABLET | Freq: Three times a day (TID) | ORAL | Status: DC | PRN
Start: 2010-02-28 — End: 2010-07-19

## 2010-02-28 MED ORDER — SODIUM CHLORIDE 0.9 % IV SOLN
0.9 % | Freq: Once | INTRAVENOUS | Status: AC
Start: 2010-02-28 — End: 2010-02-28
  Administered 2010-02-28: 18:00:00 via INTRAVENOUS

## 2010-02-28 MED ORDER — SODIUM CHLORIDE 0.9 % IV SOLN
0.9 % | Freq: Once | INTRAVENOUS | Status: AC
Start: 2010-02-28 — End: 2010-02-28
  Administered 2010-02-28: 17:00:00 via INTRAVENOUS

## 2010-02-28 MED ORDER — KETOROLAC TROMETHAMINE 30 MG/ML IJ SOLN
30 MG/ML | Freq: Once | INTRAMUSCULAR | Status: AC
Start: 2010-02-28 — End: 2010-02-28

## 2010-02-28 MED FILL — ONDANSETRON 4 MG PO TBDP: 4 MG | ORAL | Qty: 2

## 2010-02-28 MED FILL — MORPHINE SULFATE 2 MG/ML IJ SOLN: 2 mg/mL | INTRAMUSCULAR | Qty: 1

## 2010-02-28 MED FILL — KETOROLAC TROMETHAMINE 60 MG/2ML IJ SOLN: 60 MG/2ML | INTRAMUSCULAR | Qty: 2

## 2010-02-28 NOTE — ED Notes (Signed)
CHECKED ON PT, NO NEEDS AT THIS TIME.      Lacey Shaw  02/28/10 1225

## 2010-02-28 NOTE — ED Notes (Signed)
Physician Note:  alert, oriented, in no acute distress, nontoxic in appearance    I have personally performed and/or participated in the history, exam and medical decision making and agree with all pertinent clinical information.  I have also reviewed and agree with the past medical, family and social history unless otherwise noted.      Sherrilyn Rist, MD          Sherrilyn Rist, MD  03/01/10 [phone]

## 2010-02-28 NOTE — Discharge Instructions (Signed)
Take medications as directed. Do not drive/operate machinery/work/drink alcohol while taking phenergan  Return to ED at any time for worsening symptoms or worsening condition, new symptoms, increased or change in pain, fever, vomiting, failure to improve, rash, inability to drink fluids or blood stool, increased weakness, passing out, difficulty tolerating medications or any other concerns/symptoms you may have.  Follow up as directed.   Drink more water/gatorade. Wash your hands well.  Continue your home pain medications (vicodin/ativan/phenergan PO as directed by your PCP)  Follow up with Dr. Toni Arthurs on Monday for recheck and follow up care.      Headache, General, Without Cause  The specific cause of your headache may not have been found today. There are many causes and types of headache. A few common ones are:   Tension headache.    Migraine.    Infections (examples: dental and sinus infections).    Bone and/or joint problems in the neck or jaw.     Depression.    Eye problems.      These headaches are not life threatening.    Headaches can sometimes be diagnosed by a patient history and a physical exam. Sometimes, lab and imaging studies (such as x-ray and/or CT scan) are used to rule out more serious problems. In some cases, a spinal tap (lumbar puncture) may be requested. There are many times when your exam and tests may be normal on the first visit even when there is a serious problem causing your headaches. Because of that, it is very important to follow up with your doctor or local clinic for further evaluation.  HOME CARE INSTRUCTIONS   Keep follow-up appointments with your caregiver, or any specialist referral.    Only take over-the-counter or prescription medicines for pain, discomfort, or fever as directed by your caregiver.    Biofeedback, massage, or other relaxation techniques may be helpful.    Ice packs or heat applied to the head and neck can be used. Do this three to four times per day,  or as needed.    Call your doctor if you have any questions or concerns.    If you smoke, you should quit.   FINDING OUT THE RESULTS OF TESTS   If a radiology test was performed, a radiologist will review your results.    You will be contacted by the emergency department or your physician if any test results require a change in your treatment plan.    Not all test results may be available during your visit. If your test results are not back during the visit, make an appointment with your caregiver to find out the results. Do not assume everything is normal if you have not heard from your caregiver or the medical facility. It is important for you to follow up on all of your test results.   1.   2. SEEK MEDICAL CARE IF:    You develop problems with medications prescribed.    You do not respond to or obtain relief from medications.    You have a change from the usual headache.    You develop nausea or vomiting.   SEEK IMMEDIATE MEDICAL CARE IF:     If your headache becomes severe.    You have an unexplained oral temperature above 102 F (38.9 C), or as your caregiver suggests.    You have a stiff neck.    You have loss of vision.    You have muscular weakness.    You have  loss of muscular control.    You develop severe symptoms different from your first symptoms.    You start losing your balance or have trouble walking.    You feel faint or pass out.   MAKE SURE YOU:     Understand these instructions.    Will watch your condition.    Will get help right away if you are not doing well or get worse.   Document Released: 12/29/2004 Document Re-Released: 12/12/2007  [facility] Patient Information 2011 [place], [place].    Gastroenteritis  (Vomiting, Diarrhea, and/or Dehydration)  Viral gastroenteritis is also known as stomach flu. This condition affects the stomach and intestinal tract. The illness typically lasts 3 to 8 days. Most people develop an immune response. This eventually gets rid of the virus.  While this natural response develops, the virus can make you quite ill. The majority of those affected are young infants.  CAUSES  Diarrhea and vomiting are often caused by a virus. Medicines (antibiotics) that kill germs will not help unless there is also a germ (bacterial) infection.  SYMPTOMS  The most common symptom is diarrhea. This can cause severe loss of fluids (dehydration) and body salt (electrolyte) imbalance.  TREATMENT  Treatments for this illness are aimed at rehydration. Antidiarrheal medicines are not recommended. They do not decrease diarrhea volume and may be harmful. Usually, home treatment is all that is needed.  The most serious cases involve vomiting so severely that you are not able to keep down fluids taken by mouth (orally). In these cases, intravenous (IV) fluids are needed. Vomiting with viral gastroenteritis is common, but it will usually go away with treatment.  HOME CARE INSTRUCTIONS  Small amounts of fluids should be taken frequently. Large amounts at one time may not be tolerated. Plain water may be harmful in infants and the elderly. Oral rehydration solutions (ORS) are available at pharmacies and grocery stores. ORS replace water and important electrolytes in proper proportions. Sports drinks are not as effective as ORS and may be harmful due to sugars worsening diarrhea.   As a general guideline for children, replace any new fluid losses from diarrhea and/or vomiting with ORS as follows:    If your child weighs 22 pounds or under (10 kg or less), give 60-120 mL (1/4 - 1/2 cup or 2 - 4 ounces) of ORS for each diarrheal stool or vomiting episode.    If your child weighs more than 22 pounds (more than 10 kgs), give 120-240 mL (1/2 - 1 cup or 4 - 8 ounces) of ORS for each diarrheal stool or vomiting episode.    In a child with vomiting, it may be helpful to give the above ORS replacement in 5 mL (1 teaspoon) amounts every 5 minutes, then increase as tolerated.    While correcting  for dehydration, children should eat normally. However, foods high in sugar should be avoided because this may worsen diarrhea. Large amounts of carbonated soft drinks, juice, gelatin desserts, and other highly sugared drinks should be avoided.    After correction of dehydration, other liquids that are appealing to the child may be added. Children should drink small amounts of fluids frequently and fluids should be increased as tolerated.    Adults should eat normally while drinking more fluids than usual. Drink small amounts of fluids frequently and increase as tolerated. Drink enough water and fluids to keep your urine clear or pale yellow. Broths, weak decaffeinated tea, lemon-lime soft drinks (allowed to go flat), and  ORS replace fluids and electrolytes.   Avoid:   Carbonated drinks.    Juice.     Extremely hot or cold fluids.      Caffeine drinks.      Fatty, greasy foods.     Alcohol.    Tobacco.     Too much intake of anything at one time.      Gelatin desserts.       Probiotics are active cultures of beneficial bacteria. They may lessen the amount and number of diarrheal stools in adults. Probiotics can be found in yogurt with active cultures and in supplements.    Wash your hands well to avoid spreading bacteria and viruses.    Antidiarrheal medicines are not recommended for infants and children.    Only take over-the-counter or prescription medicines for pain, discomfort, or fever as directed by your caregiver. Do not give aspirin to children.    For adults with dehydration, ask your caregiver if you should continue all prescribed and over-the-counter medicines.    If your caregiver has given you a follow-up appointment, it is very important to keep that appointment. Not keeping the appointment could result in a lasting (chronic) or permanent injury and disability. If there is any problem keeping the appointment, you must call to reschedule.   SEEK IMMEDIATE MEDICAL CARE IF:   You are  unable to keep fluids down.    There is no urine output in 6 to 8 hours or there is only a small amount of very dark urine.    You develop shortness of breath.    There is blood in the vomit (may look like coffee grounds) or stool.    Belly (abdominal) pain develops, increases, or localizes.    There is persistent vomiting or diarrhea.    You or your child has an oral temperature above 102 F (38.9 C), not controlled by medicine.    Your baby is older than 3 months with a rectal temperature of 102 F (38.9 C) or higher.    Your baby is 83 months old or younger with a rectal temperature of 100.4 F (38 C) or higher.   MAKE SURE YOU:   Understand these instructions.    Will watch your condition.    Will get help right away if you are not doing well or get worse.   Document Released: 04/19/2002 Document Re-Released: 06/18/2009  [facility] Patient Information 2011 [place], [place].    Abdominal Pain  Abdominal pain can be caused by many things. Your caregiver decides the seriousness of your pain by an examination and possibly blood tests and X-rays. Many cases can be observed and treated at home. Most abdominal pain is not caused by a disease and will probably improve without treatment. However, in many cases, more time must pass before a clear cause of the pain can be found. Before that point, it may not be known if you need more testing, or if hospitalization or surgery is needed.  HOME CARE INSTRUCTIONS   Do not take laxatives unless directed by your caregiver.    Take pain medicine only as directed by your caregiver.    Only take over-the-counter or prescription medicines for pain, discomfort, or fever as directed by your caregiver.    Try a clear liquid diet (broth, tea, or water) as ordered by your caregiver. Slowly move to a bland diet as tolerated.   SEEK IMMEDIATE MEDICAL CARE IF:   The pain does not go away.  You or your child has an oral temperature above 102 F (38.9 C), not controlled by  medicine.    You keep throwing up (vomiting).    The pain is felt only in portions of the abdomen. Pain in the right side could possibly be appendicitis. In an adult, pain in the left lower portion of the abdomen could be colitis or diverticulitis.    You pass bloody or black tarry stools.   MAKE SURE YOU:   Understand these instructions.    Will watch your condition.    Will get help right away if you are not doing well or get worse.   Document Released: 12/29/2004 Document Re-Released: 03/25/2009  [facility] Patient Information 2011 [place], [place].

## 2010-02-28 NOTE — ED Provider Notes (Signed)
Pt presents today in the ED with abdominal cramping, vomiting, and diarrhea.  Onset of symptoms yesterday evening.  Subjective fever, diaphoretic when vomiting.  Headache.      Patient is a 35 y.o. female presenting with vomiting. The history is provided by the patient. No language interpreter was used.   Emesis   This is a new problem. The current episode started yesterday. The problem occurs 5 to 10 times per day. The problem has been gradually worsening. The emesis has an appearance of stomach contents. There has been no fever. Associated symptoms include chills, diarrhea, headaches, myalgias and sweats. Pertinent negatives include no abdominal pain, no arthralgias, no cough, no fever and no URI.         PAST MEDICAL HISTORY  has a past medical history of Migraine and Brain Tumor.    PAST SURGICAL HISTORY  has past surgical history that includes Cesarean section and brain surgery.    FAMILY HISTORY  None    SOCIAL HISTORY   reports that she has never smoked. She does not have any smokeless tobacco history on file.  She reports that she does not currently drink alcohol or use illicit drugs.    HOME MEDICATIONS     Prior to Admission medications    Medication Sig Start Date End Date Taking? Authorizing Provider   oxycodone-acetaminophen (PERCOCET) 5-325 MG per tablet Take 1 tablet by mouth every 4 hours as needed.    Historical Provider, MD   therapeutic multivitamin-minerals (THERAGRAN-M) tablet Take 1 tablet by mouth daily.    Historical Provider, MD        ALLERGIES   has no known allergies.           Review of Systems   Constitutional: Positive for chills, diaphoresis (with vomiting), activity change (Fatigued but still "works out" exercises daily), appetite change (Decreased) and fatigue. Negative for fever and unexpected weight change.   HENT: Negative for ear pain, congestion, sore throat, rhinorrhea and ear discharge.    Eyes: Negative for discharge and redness.   Respiratory: Negative for cough and  shortness of breath.    Cardiovascular: Negative for chest pain and palpitations.   Gastrointestinal: Positive for nausea, vomiting and diarrhea. Negative for abdominal pain, constipation, blood in stool, abdominal distention and anal bleeding.   Genitourinary: Positive for decreased urine volume. Negative for dysuria, urgency, frequency, hematuria, flank pain, vaginal bleeding, vaginal discharge, difficulty urinating, vaginal pain and pelvic pain.   Musculoskeletal: Positive for myalgias. Negative for back pain, joint swelling and arthralgias.   Skin: Negative for color change, rash and wound.   Neurological: Positive for weakness (generally nonfocal) and headaches. Negative for seizures, syncope and light-headedness.   Psychiatric/Behavioral: Negative for confusion.   All other systems reviewed and are negative.        Physical Exam   Nursing note and vitals reviewed.  Constitutional: She is oriented to person, place, and time. She appears well-developed and well-nourished. She is cooperative.  Non-toxic appearance. She does not have a sickly appearance. She does not appear ill. No distress.   HENT:   Head: Normocephalic and atraumatic.       Right Ear: Hearing, tympanic membrane, external ear and ear canal normal.   Left Ear: Hearing, tympanic membrane, external ear and ear canal normal.   Nose: Nose normal.   Mouth/Throat: Uvula is midline, oropharynx is clear and moist and mucous membranes are normal. No oropharyngeal exudate.        Moist MM noted  Eyes: Conjunctivae and EOM are normal. Pupils are equal, round, and reactive to light. Right eye exhibits no discharge. Left eye exhibits no discharge. No scleral icterus. Right eye exhibits normal extraocular motion. Left eye exhibits normal extraocular motion.   Neck: Normal range of motion and full passive range of motion without pain. Neck supple. No JVD present. No spinous process tenderness and no muscular tenderness present. No rigidity. No edema, no  erythema and normal range of motion present. No thyromegaly present.   Cardiovascular: Normal rate, regular rhythm, normal heart sounds, intact distal pulses and normal pulses.  Exam reveals no gallop and no friction rub.    No murmur heard.  Pulses:       Radial pulses are 2+ on the right side, and 2+ on the left side.        Dorsalis pedis pulses are 2+ on the right side, and 2+ on the left side.        Posterior tibial pulses are 2+ on the right side, and 2+ on the left side.   Pulmonary/Chest: Effort normal and breath sounds normal. No respiratory distress. She has no decreased breath sounds. She has no wheezes. She has no rhonchi. She has no rales.   Abdominal: Soft. Normal appearance and bowel sounds are normal. She exhibits no distension. There is no hepatosplenomegaly. Tenderness is present in the epigastric area. She has no rigidity, no rebound, no guarding, no CVA tenderness, no pain at McBurney's point and no Murphy's sign. No hernia.   Musculoskeletal: Normal range of motion. She exhibits no edema.        Cervical back: Normal.        Thoracic back: Normal.        Lumbar back: Normal.   Lymphadenopathy:     She has no cervical adenopathy.   Neurological: She is alert and oriented to person, place, and time. She has normal strength. She is not disoriented. She displays no tremor. No cranial nerve deficit or sensory deficit. She exhibits normal muscle tone. She displays a negative Romberg sign. She displays no seizure activity. GCS eye subscore is 4. GCS verbal subscore is 5. GCS motor subscore is 6.   Reflex Scores:       Patellar reflexes are 2+ on the right side and 2+ on the left side.       Age Appropriate Interaction   Skin: Skin is warm, dry and intact. No rash noted. She is not diaphoretic. No cyanosis or erythema. No pallor. Nails show no clubbing.   Psychiatric: She has a normal mood and affect. Her speech is normal and behavior is normal.       Procedures    MDM  Number of Diagnoses or  Management Options     Amount and/or Complexity of Data Reviewed  Clinical lab tests: ordered and reviewed  Discussion of test results with the performing providers: yes (DR. Surina Storts  )    Risk of Complications, Morbidity, and/or Mortality  Presenting problems: moderate  Diagnostic procedures: moderate  Management options: moderate    Patient Progress  Patient progress: improved      Labs  Results for orders placed during the hospital encounter of 02/28/10   POC URINALYSIS       Component Value Range    Color, UA YELLOW      Clarity, UA CLEAR      Glucose, UA POC NEG      Bilirubin, UA NEG      Ketones, UA  NEG      Spec Grav, UA 1.030      Blood, UA POC NEG      pH, UA 5.5      Protein, UA POC TRACE      Urobilinogen, UA 0.2      Leukocytes, UA NEG      Nitrite, UA NEG      Appearance       BASIC METABOLIC PANEL       Component Value Range    Sodium 138  136 - 145 (mEq/L)    Potassium 3.6  3.5 - 5.1 (mEq/L)    Chloride 107  99 - 110 (mEq/L)    CO2 27  21 - 32 (mEq/L)    Anion Gap 4.2      Glucose 104 (*) 70 - 99 (mg/dl)    BUN 11  7 - 18 (mg/dl)    Creatinine, Ser 0.9  0.6 - 1.1 (mg/dl)    Calcium 9.5  8.3 - 10.6 (mg/dl)    GFR, Estimated >78  >60 mL/min/1.67m2     GFR Est, African/Amer >60  SEE BELOW    HEPATIC FUNCTION PANEL       Component Value Range    Alb 4.5  3.4 - 5.0 (gm/dl)    Total Bilirubin 4.69 (*) 0.0 - 1.0 (mg/dl)    Bilirubin, Direct 6.29 (*) 0.0 - 0.3 (mg/dl)    Bilirubin, Indirect 1.35 (*) 0.0 - 1.0 (mg/dl)    Alkaline Phosphatase 48  45 - 129 (U/L)    ALT 26  10 - 40 (U/L)    AST 26  15 - 37 (U/L)    Total Protein 8.0  6.4 - 8.2 (gm/dl)   LIPASE       Component Value Range    Lipase 46  5.6 - 51.3 (U/L)   CBC WITH AUTO DIFFERENTIAL       Component Value Range    WBC 5.2  4.0 - 11.0 (X 1000)    RBC 5.16  4.0 - 5.2 (X(10)6)    Hemoglobin 15.6  12.0 - 16.0 (gm/dl)    Hematocrit 52.8  41.3 - 48.0 (%)    MCV 88.7  80 - 100 (fl)    MCH 30.3  26 - 34 (pg)    MCHC 34.2  31 - 36 (gm/dl)    RDW 24.4  01.0 -  27.2 (%)    Platelets 188  135 - 450 (X(10)3)    MPV 8.7  5.0 - 10.5 (fl)    Segs Relative 93.9 (*) 42.0 - 63.0 (%)    Lymphocytes Relative 4.7 (*) 25.0 - 40.0 (%)    Monocytes Relative 1.2  0.0 - 12.0 (%)    Eosinophils Relative 0.2  0.0 - 5.0 (%)    Basophils Relative 0.0  0.0 - 2.0 (%)    GRANULOCYTE ABSOLUTE COUNT 4.9  1.7 - 7.7 (X(10)3)    Lymphocytes Absolute 0.2 (*) 1.0 - 5.1 (X(10)3)    Monocytes Absolute 0.1  0.0 - 1.3 (X(10)3)    Eosinophils Absolute 0.0  0.0 - 0.6 (X(10)3)    Basophils Absolute 0.0  0.0 - 0.2 (X(10)3)    Differential Type Auto     POC PREGNANCY UR-QUAL       Component Value Range    Preg Test, Ur NEG             Radiology       EKG Interpretation  Discussed results, diagnosis and plan with patient and/or family.  Questions addressed.  Disposition and follow-up agreed upon.    PROVIDER NOTE  BP 101/70   Pulse 113   Temp(Src) 97.9 ??F (36.6 ??C) (Oral)   Resp 18   Ht 5\' 2"  (1.575 m)   Wt 120 lb (54.432 kg)   BMI 21.95 kg/m2   SpO2 100%   LMP 02/15/2010   Patient Vitals for the past 24 hrs:   BP Temp Temp src Pulse Resp SpO2 Height Weight   02/28/10 1048 101/70 mmHg 97.9 ??F (36.6 ??C) Oral 113  18  100 % 5\' 2"  (1.575 m) 120 lb (54.432 kg)      Pt reports "this is not a migraine, I'm dehydrated" Reports N/V/D with intractable vomiting.   Boyfriend sick with diarrhea last week. Frontal Headache. Took phenergan at home but vomiting pill up. Denies fever but feels cold chills.   Abdominal cramps that radiate down legs- feels this is "dehydration" Reports when she vomits and has diarrhea she intermittently feels better but then abdominal cramps and urge for vomiting and diarrhea reoccur.   +Body Aches  Denies HX EGD or colonoscopy. Reports told by PCP Dr. Sharyn Lull "may have stomach ulcer" Denies urinary complaints. Denies vaginal discharge or pain. Denies recent ABX medications.  Prior Hx Cesarean Section  [facility] 1 mo ago.  Pt attends [employer]    1:33 PM  Pt reports she feels  better but body still achy and requesting more pain medication. Headache improved. Pt advised of her abnormal labs and does not want GB US which we offered in the ED. Pt and attending Dr. Harlow Mares and I do feel this is more AGE and not biliary colic but pt instructed on labs, monitor symptoms and return for worsening pain, vomiting, diarrhea, fever, increased or change in abdominal pain, dyspepsia etc. Pt in full agreement with this.    Pt medication for body aches with morphine after dw attending Dr. Harlow Mares    Pt report symptomatic improvement, pain resolution and dc home.    All information has been directly reviewed and discussed with Sherrilyn Rist, MD who is in full agreement with all ER care, course, assessment, diagnosis, disposition, results, medications, medical decision making and follow up care. Sherrilyn Rist, MD agrees with discharging the patient home at this time has given verbal orders to discharge pt and does not feel there is any acute emergency pathology occuring to require further medical intervention, treatment or admission. Sherrilyn Rist, MD has provided face to face time with the patient, direct evaluation of the patient and has personally reviewed all the ED workup, results, diagnosis, treatment and is in full agreement with all the documentation provided by CNP and has given verbal orders to discharge this patient home.  All information reviewed and resulted with patient who is in full agreement with being discharged home. Pt will be discharged in stable condition. Sherrilyn Rist, MD notified of the patients ED workup, care, course and results. Sherrilyn Rist, MD reviewed all pt ED testing and results. Sherrilyn Rist, MD provided face to face time with this patient and feels the patient can be discharged home in stable condition.  Sherrilyn Rist, MD does not feel there is any acute emergency pathology occuring in the patient at this time and has given verbal orders  to CNP to discharge pt home.    All information has been resulted, reviewed and discussed with  the patient who reports understanding and symptomatic improvement in ED. The patient has also been educated on discharge medication, follow-up care and signs/symptoms to return to ED. Patient advised at any time if their symptoms return/worsen or they develop fever, vomiting, difficulty taking medications, increased/change in pain, failure to improve or any other concerns/symptoms they may have, to return to ED at any time.    The patient will be discharged in stable condition. Pt reports she has vicodin/phenergan at home. Radial HR 88 prior to DC.             Army Chaco, NP  03/01/10 [id number]    Sherrilyn Rist, MD  03/01/10 [phone]

## 2010-02-28 NOTE — ED Notes (Signed)
IV bolus 1000 cc NS complete. Line flushed and clamped. Pt given saltines and water pt has no other needs at this time will continue to monitor.    Thomasene Ripple, RN  02/28/10 1408

## 2010-02-28 NOTE — ED Notes (Deleted)
1000 cc bolus of NS complete. Line flushed and clamped.    Thomasene Ripple, RN  02/28/10 1322

## 2010-02-28 NOTE — ED Notes (Signed)
Pt lying quietly in room friend at bedside. Pt has no needs at this time will continue to monitor.    Thomasene Ripple, RN  02/28/10 1321

## 2010-05-02 ENCOUNTER — Inpatient Hospital Stay
Admit: 2010-05-02 | Discharge: 2010-05-02 | Disposition: A | Discharge status: Home / Self Care | Attending: Emergency Medicine

## 2010-05-02 MED ORDER — ONDANSETRON HCL 4 MG/2ML IJ SOLN
4 MG/2ML | Freq: Once | INTRAMUSCULAR | Status: AC
Start: 2010-05-02 — End: 2010-05-02
  Administered 2010-05-02: 12:00:00 via INTRAVENOUS

## 2010-05-02 MED ORDER — HYDROMORPHONE HCL 1 MG/ML IJ SOLN
1 MG/ML | Freq: Once | INTRAMUSCULAR | Status: AC
Start: 2010-05-02 — End: 2010-05-02

## 2010-05-02 MED ORDER — HYDROMORPHONE HCL 1 MG/ML IJ SOLN
1 MG/ML | Freq: Once | INTRAMUSCULAR | Status: AC
Start: 2010-05-02 — End: 2010-05-02
  Administered 2010-05-02: 13:00:00 via INTRAVENOUS

## 2010-05-02 MED ORDER — SODIUM CHLORIDE 0.9 % IV BOLUS
0.9 % | Freq: Once | INTRAVENOUS | Status: AC
Start: 2010-05-02 — End: 2010-05-02
  Administered 2010-05-02: 12:00:00 via INTRAVENOUS

## 2010-05-02 MED ORDER — SODIUM CHLORIDE 0.9 % IV BOLUS
0.9 % | Freq: Once | INTRAVENOUS | Status: AC
Start: 2010-05-02 — End: 2010-05-02
  Administered 2010-05-02: 13:00:00 via INTRAVENOUS

## 2010-05-02 MED ORDER — HYDROMORPHONE HCL 1 MG/ML IJ SOLN
1 MG/ML | Freq: Once | INTRAMUSCULAR | Status: AC
Start: 2010-05-02 — End: 2010-05-02
  Administered 2010-05-02: 12:00:00 via INTRAVENOUS

## 2010-05-02 MED ORDER — HYDROMORPHONE HCL 1 MG/ML IJ SOLN
1 MG/ML | INTRAMUSCULAR | Status: AC
Start: 2010-05-02 — End: 2010-05-02
  Administered 2010-05-02: 13:00:00 via INTRAVENOUS

## 2010-05-02 MED FILL — ONDANSETRON HCL 4 MG/2ML IJ SOLN: 4 MG/2ML | INTRAMUSCULAR | Qty: 2

## 2010-05-02 MED FILL — HYDROMORPHONE HCL 1 MG/ML IJ SOLN: 1 MG/ML | INTRAMUSCULAR | Qty: 1

## 2010-05-02 NOTE — ED Notes (Signed)
Pt states pain is improving but still there. SBP dropped to 95. IVF bolus infusing. Ice pack provided per request. Denies any further needs.    Jodean Lima, RN  05/02/10 [phone]

## 2010-05-02 NOTE — Discharge Instructions (Signed)
INSTRUCTION SHEETS GIVEN:    DIAGNOSIS:  The encounter diagnosis was Migraine headache.        ADDITIONAL INSTRUCTIONS FOR ALL PATIENTS:  -If you have been prescribed an antibiotic TAKE IT AS DIRECTED UNTIL IT IS ALL FINISHED.  -If you HAVE RECEIVED OR BEEN PRESCRIBED A MEDICATION THAT MAY CAUSE DROWSINESS. DO NOT DRIVE, DRINK ALCOHOL, OR OPERATE MACHINERY THAT REQUIRES YOU TO BE ALERT.    -If you had an EKG and/or X-Ray reading made in the Emergency Department, it will be reviewed by a Cardiologist and/or Radiologist. If the review changes your diagnosis, you will be contacted.  -If you had a specimen collected for culture, a CULTURE REPORT takes 48-72 hours to generate: You will be contacted if a change in treatment is needed.    -Return if your condition worsens or if you have severe pain, worsening of symptoms such as fever, vomiting or difficulty breathing.     If you were prescribed an outpatient test Please Call [facility] SCH[facility] at [phone]  to schedule a time for your test that was ordered.      If you have any trouble getting to see the physician that we have referred you to today, please call the [facility] at [phone].  Please leave a voice message if they are unavailable and they will return your call.  Migraine Headache  A migraine headache is an intense, throbbing pain on one or both sides of your head. The exact cause of a migraine headache is not always known. A migraine may be caused when nerves in the brain become irritated and release chemicals that cause swelling (inflammation) within blood vessels, causing pain. Many migraine sufferers have a family history of migraines. Before you get a migraine you may or may not get an aura. An aura is a group of symptoms that can predict the beginning of a migraine. An aura may include:   Visual changes such as:    Flashing lights.    Seeing bright spots or zig-zag lines.    Tunnel vision.    Feelings of numbness.    Trouble  talking.    Muscle weakness.   SYMPTOMS OF A MIGRAINE   A migraine headache has one or more of the following symptoms:   Pain on one or both sides of your head.    Pain that is pulsating or throbbing in nature.    Pain that is severe enough to prevent daily activities.    Pain that is aggravated by any daily physical activity.    Nausea (feeling sick to your stomach), vomiting or both.    Pain with exposure to bright lights, loud noises or activity.    General sensitivity to bright lights or loud noises.   MIGRAINE TRIGGERS  A migraine headache can be triggered by many things. Examples of triggers include:    Alcohol.    Smoking.    Stress.    It may be related to menses (female menstruation).    Aged cheeses.    Foods or drinks that contain nitrates, glutamate, aspartame or tyramine.    Lack of sleep.    Chocolate.    Caffeine.    Hunger.    Medications such as nitroglycerine (used to treat chest pain), birth control pills, estrogen and some blood pressure medications.   DIAGNOSIS   A migraine headache is often diagnosed based on:    Your symptoms.    Physical examination.    A CT scan of   your head may be ordered to see if your headaches are caused from other medical conditions.   HOME CARE INSTRUCTIONS   Medications can help prevent migraines if they are recurrent or should they become recurrent. Your caregiver can help you with a medication or treatment program that will be helpful to you.    If you get a migraine, it may be helpful to lie down in a dark, quiet room.    It may be helpful to keep a headache diary. This may help you find a trend as to what may be triggering your headaches.   SEEK IMMEDIATE MEDICAL CARE IF:    You do not get relief from the medications given to you or you have a recurrence of pain.    You have confusion, personality changes or seizures.    You have headaches that wake you from sleep.    You have an increased frequency in your headaches.    You have a  stiff neck.    You have a loss of vision.    You have muscle weakness.    You start losing your balance or have trouble walking.    You feel faint or pass out.   MAKE SURE YOU:    Understand these instructions.    Will watch your condition.    Will get help right away if you are not doing well or get worse.   Document Released: 12/29/2004 Document Re-Released: 10/26/2008  [employer] Patient Information 2012 [employer].

## 2010-05-02 NOTE — ED Provider Notes (Signed)
Pt presents today in the ED with a headache, onset 8 days ago.  Pt usually gets headache during her period, however her cycle ended on Tuesday.  Has been taking imitrex, no relief.  Nausea, no vomiting.  Headache is on the left side.      Patient is a 35 y.o. female presenting with headaches. The history is provided by the patient. No language interpreter was used.   Headache  The primary symptoms include headaches and nausea. Primary symptoms do not include fever or vomiting. The symptoms began more than 1 week ago. The symptoms are worsening. The neurological symptoms are focal.   The headache is not associated with aura, photophobia, neck stiffness, weakness or loss of balance.   Additional symptoms include pain. Additional symptoms do not include neck stiffness, weakness, leg pain, loss of balance, photophobia or aura.     PAST MEDICAL HISTORY   has a past medical history of Migraine and Brain Tumor.    PAST SURGICAL HISTORY   has past surgical history that includes Cesarean section and brain surgery.    FAMILY HISTORY  family history is not on file.    SOCIAL HISTORY   reports that she has never smoked. She does not have any smokeless tobacco history on file. She reports that she does not drink alcohol or use illicit drugs.    ALLERGIES  No Known Allergies    HOME MEDICATIONS  Prior to Admission medications    Medication Sig Start Date End Date Taking? Authorizing Provider   ondansetron (ZOFRAN ODT) 4 MG disintegrating tablet Take 1 tablet by mouth every 8 hours as needed for Nausea for 10 doses. 02/28/10   Army Chaco, NP   oxycodone-acetaminophen (PERCOCET) 5-325 MG per tablet Take 1 tablet by mouth every 4 hours as needed.    Historical Provider, MD   therapeutic multivitamin-minerals (THERAGRAN-M) tablet Take 1 tablet by mouth daily.    Historical Provider, MD        IMMUNIZATIONS    There is no immunization history on file for this patient.    Review of Systems   Constitutional: Negative for fever.    HENT: Negative for rhinorrhea, neck stiffness and ear discharge.    Eyes: Negative for photophobia, discharge and redness.   Respiratory: Negative for cough and shortness of breath.    Cardiovascular: Negative for chest pain and palpitations.   Gastrointestinal: Positive for nausea. Negative for vomiting, abdominal pain and diarrhea.   Genitourinary: Negative for flank pain and difficulty urinating.   Musculoskeletal: Negative for back pain and joint swelling.   Skin: Negative for rash.   Neurological: Positive for headaches. Negative for syncope, weakness and loss of balance.   Psychiatric/Behavioral: Negative for confusion.   All other systems reviewed and are negative.        Physical Exam   Constitutional: She is oriented to person, place, and time.   HENT:   Head: Normocephalic.   Mouth/Throat: Oropharynx is clear and moist.   Eyes: Conjunctivae are normal.   Neck: Neck supple.   Cardiovascular: Normal rate and regular rhythm.    Pulmonary/Chest: Effort normal and breath sounds normal.   Abdominal: Soft. No tenderness.   Musculoskeletal: Normal range of motion.   Neurological: She is alert and oriented to person, place, and time.   Skin: No rash noted.       Procedures    MDM  Number of Diagnoses or Management Options  Risk of Complications, Morbidity, and/or Mortality  Presenting problems:  high  Diagnostic procedures: high  Management options: high    Patient Progress  Patient progress: improved      Labs      Radiology      EKG Interpretation.        Discussed results, diagnosis and plan with patient and/or family.  Questions addressed.  Disposition and follow-up agreed upon.      Althea Charon, MD  05/02/10 [phone]

## 2010-05-02 NOTE — ED Notes (Signed)
1st liter NS bolus complete. 2nd liter started.    Jodean Lima, RN  05/02/10 [phone]

## 2010-05-02 NOTE — ED Notes (Signed)
2nd bolus complete.    Jodean Lima, RN  05/02/10 [phone]

## 2010-05-02 NOTE — ED Notes (Signed)
Report received from [facility], RN. Pt just medicated. States she has to go to the bathroom but wants to wait until the medicine kicks in. Call light in reach. Will monitor.    Jodean Lima, RN  05/02/10 [phone]

## 2010-05-02 NOTE — ED Notes (Signed)
Pt states she is "scared to go home because the pain isn't gone". MD informed. meds ordered.    Jodean Lima, RN  05/02/10 [phone]

## 2010-05-02 NOTE — ED Notes (Signed)
Pt advised not to drive or drink after taking narcotics. S.O. states they are driving pt home. Pt ambulated out of er without difficulty. Denies any needs at time of d/c.     Jodean Lima, RN  05/02/10 1034

## 2010-07-19 ENCOUNTER — Inpatient Hospital Stay
Admit: 2010-07-19 | Discharge: 2010-07-19 | Disposition: A | Discharge status: Home / Self Care | Attending: Emergency Medicine

## 2010-07-19 LAB — CBC WITH AUTO DIFFERENTIAL
Basophils %: 0.8 % (ref 0.0–2.0)
Basophils Absolute: 0 10*3 (ref 0.0–0.2)
Eosinophils %: 0.6 % (ref 0.0–5.0)
Eosinophils Absolute: 0 10*3 (ref 0.0–0.6)
Granulocyte Absolute Count: 3.3 10*3 (ref 1.7–7.7)
Hematocrit: 42.5 % (ref 36.0–48.0)
Hemoglobin: 14.7 gm/dl (ref 12.0–16.0)
Lymphocytes %: 25.7 % (ref 25.0–40.0)
Lymphocytes Absolute: 1.3 10*3 (ref 1.0–5.1)
MCH: 30.8 pg (ref 26–34)
MCHC: 34.7 gm/dl (ref 31–36)
MCV: 88.9 fl (ref 80–100)
MPV: 8.9 fl (ref 5.0–10.5)
Monocytes %: 6.3 % (ref 0.0–12.0)
Monocytes Absolute: 0.3 10*3 (ref 0.0–1.3)
Platelets: 215 10*3 (ref 135–450)
RBC: 4.78 10*6 (ref 4.0–5.2)
RDW: 12.9 % (ref 11.5–14.5)
Segs Relative: 66.6 % — ABNORMAL HIGH (ref 42.0–63.0)
WBC: 4.9 10*3 (ref 4.0–11.0)

## 2010-07-19 LAB — COMPREHENSIVE METABOLIC PANEL
ALT: 27 U/L (ref 10–40)
AST: 31 U/L (ref 15–37)
Albumin/Globulin Ratio: 1.5 (ref 1.1–2.2)
Albumin: 4.6 gm/dl (ref 3.4–5.0)
Alkaline Phosphatase: 47 U/L (ref 45–129)
Anion Gap: 10
BUN: 10 mg/dl (ref 7–18)
CO2: 25 mEq/L (ref 21–32)
Calcium: 10 mg/dl (ref 8.3–10.6)
Chloride: 107 mEq/L (ref 99–110)
Creatinine: 0.9 mg/dl (ref 0.6–1.1)
GFR Est, African/Amer: >60
GFR, Estimated: >60 (ref >60)
Glucose: 119 mg/dl — ABNORMAL HIGH (ref 70–99)
Potassium: 3.5 mEq/L (ref 3.5–5.1)
Sodium: 142 mEq/L (ref 136–145)
Total Bilirubin: 0.71 mg/dl (ref 0.0–1.0)
Total Protein: 7.7 gm/dl (ref 6.4–8.2)

## 2010-07-19 MED ORDER — PROMETHAZINE HCL 25 MG PO TABS
25 MG | ORAL_TABLET | Freq: Four times a day (QID) | ORAL | Status: AC | PRN
Start: 2010-07-19 — End: 2010-07-26

## 2010-07-19 MED ORDER — SODIUM CHLORIDE 0.9 % IV SOLN
0.9 % | Freq: Once | INTRAVENOUS | Status: AC
Start: 2010-07-19 — End: 2010-07-19
  Administered 2010-07-19: 14:00:00 via INTRAVENOUS

## 2010-07-19 MED ORDER — ONDANSETRON HCL 4 MG/2ML IJ SOLN
4 MG/2ML | Freq: Once | INTRAMUSCULAR | Status: AC
Start: 2010-07-19 — End: 2010-07-19
  Administered 2010-07-19: 16:00:00 via INTRAVENOUS

## 2010-07-19 MED ORDER — KETOROLAC TROMETHAMINE 30 MG/ML IJ SOLN
30 MG/ML | Freq: Once | INTRAMUSCULAR | Status: AC
Start: 2010-07-19 — End: 2010-07-19
  Administered 2010-07-19: 14:00:00 via INTRAVENOUS

## 2010-07-19 MED ORDER — ONDANSETRON HCL 4 MG/2ML IJ SOLN
4 MG/2ML | Freq: Once | INTRAMUSCULAR | Status: AC
Start: 2010-07-19 — End: 2010-07-19
  Administered 2010-07-19: 14:00:00 via INTRAVENOUS

## 2010-07-19 MED ORDER — HYDROMORPHONE HCL 2 MG/ML IJ SOLN
2 MG/ML | Freq: Once | INTRAMUSCULAR | Status: AC
Start: 2010-07-19 — End: 2010-07-19
  Administered 2010-07-19: 14:00:00 via INTRAVENOUS

## 2010-07-19 MED ORDER — LOPERAMIDE HCL 2 MG PO CAPS
2 MG | ORAL_CAPSULE | Freq: Four times a day (QID) | ORAL | Status: AC | PRN
Start: 2010-07-19 — End: 2010-07-29

## 2010-07-19 MED ORDER — HYDROMORPHONE HCL 2 MG/ML IJ SOLN
2 MG/ML | Freq: Once | INTRAMUSCULAR | Status: AC
Start: 2010-07-19 — End: 2010-07-19
  Administered 2010-07-19: 15:00:00 via INTRAVENOUS

## 2010-07-19 MED FILL — HYDROMORPHONE HCL 2 MG/ML IJ SOLN: 2 MG/ML | INTRAMUSCULAR | Qty: 1

## 2010-07-19 MED FILL — ONDANSETRON HCL 4 MG/2ML IJ SOLN: 4 MG/2ML | INTRAMUSCULAR | Qty: 2

## 2010-07-19 MED FILL — KETOROLAC TROMETHAMINE 30 MG/ML IJ SOLN: 30 mg/mL | INTRAMUSCULAR | Qty: 1

## 2010-07-19 NOTE — ED Notes (Signed)
1 liter NS completely infused.     Maylon Cos, RN  07/19/10 1148

## 2010-07-19 NOTE — Discharge Instructions (Signed)
OPIATES OR OTHER CONTROLLED SUBSTANCES    You have been prescribed a medication that is a controlled substance.  Controlled substances include pain medications known as opiates and sedative nerve medications known as benzodiazepines.    Some common opiates include:    Codeine (such as Tylenol #3)  Hydrocodone (Vicodin, Lortab, Lorcet, Norco)  Oxycodone (Percocet, Percodan, Oxycodone, Oxy IR)    Some common benzodiazepines include:    Diazepam (Valium)  Lorazepam (Ativan)  Alprazolam (Xanax)  Clonazepam (Klonopin)  Oxazepam (Serax)    All of these controlled substances are highly addictive and frequently abused.  Misuse can and frequently does lead to addiction as well as overdose and death.  Short term supplies, 3 days or less, are prescribed because of the highly addictive nature of the medication.  Any of the controlled substance medication NOT taken should be disposed of properly and NOT SAVED.  The recommended method of disposing of unused medications is:       Place the medicines in a sealable plastic bag.  If the medicine is a solid, crush it or add water to dissolve it.    Add something undesirable (cat litter, coffee grounds, etc.)    Dispose of sealed bag in household trash    Do not flush or pour unused medicines down a sink or drain.    Also, because of the addictive nature and frequent abuse, these medications are sometimes stolen.  These medications should be kept in a safe place where they cannot be stolen.  Do not keep them in your car or purse.  Lost or stolen prescriptions for controlled substances WILL NOT BE REFILLED in this emergency department, regardless of whether a police report was filed.    Migraine Headache  A migraine headache is an intense, throbbing pain on one or both sides of your head. The exact cause of a migraine headache is not always known. A migraine may be caused when nerves in the brain become irritated and release chemicals that cause swelling (inflammation) within blood  vessels, causing pain. Many migraine sufferers have a family history of migraines. Before you get a migraine you may or may not get an aura. An aura is a group of symptoms that can predict the beginning of a migraine. An aura may include:   Visual changes such as:    Flashing lights.    Seeing bright spots or zig-zag lines.    Tunnel vision.    Feelings of numbness.    Trouble talking.    Muscle weakness.   SYMPTOMS OF A MIGRAINE   A migraine headache has one or more of the following symptoms:   Pain on one or both sides of your head.    Pain that is pulsating or throbbing in nature.    Pain that is severe enough to prevent daily activities.    Pain that is aggravated by any daily physical activity.    Nausea (feeling sick to your stomach), vomiting or both.    Pain with exposure to bright lights, loud noises or activity.    General sensitivity to bright lights or loud noises.   MIGRAINE TRIGGERS  A migraine headache can be triggered by many things. Examples of triggers include:    Alcohol.    Smoking.    Stress.    It may be related to menses (female menstruation).    Aged cheeses.    Foods or drinks that contain nitrates, glutamate, aspartame or tyramine.    Lack of sleep.  Chocolate.    Caffeine.    Hunger.    Medications such as nitroglycerine (used to treat chest pain), birth control pills, estrogen and some blood pressure medications.   DIAGNOSIS   A migraine headache is often diagnosed based on:    Your symptoms.    Physical examination.    A CT scan of your head may be ordered to see if your headaches are caused from other medical conditions.   HOME CARE INSTRUCTIONS   Medications can help prevent migraines if they are recurrent or should they become recurrent. Your caregiver can help you with a medication or treatment program that will be helpful to you.    If you get a migraine, it may be helpful to lie down in a dark, quiet room.    It may be helpful to keep a headache  diary. This may help you find a trend as to what may be triggering your headaches.   SEEK IMMEDIATE MEDICAL CARE IF:    You do not get relief from the medications given to you or you have a recurrence of pain.    You have confusion, personality changes or seizures.    You have headaches that wake you from sleep.    You have an increased frequency in your headaches.    You have a stiff neck.    You have a loss of vision.    You have muscle weakness.    You start losing your balance or have trouble walking.    You feel faint or pass out.   MAKE SURE YOU:    Understand these instructions.    Will watch your condition.    Will get help right away if you are not doing well or get worse.   Document Released: 12/29/2004 Document Re-Released: 10/26/2008  [facility] Patient Information 2012 [place], [place].  Palpitations (Irregular Heartbeat)  A palpitation is the feeling that your heartbeat is irregular or is faster than normal. Although this is frightening, it usually is not serious. Palpitations may be caused by excesses of smoking, caffeine, or alcohol. They are also brought on by stress and anxiety. Sometimes, they are caused by heart disease. Unless otherwise noted, your caregiver did not find any signs of serious illness at this time.  HOME CARE INSTRUCTIONS  To help prevent palpitations:   Drink decaffeinated coffee, tea, and soda pop. Avoid chocolate.    If you smoke or drink alcohol, quit or cut down as much as possible.    Reduce your stress or anxiety level. Biofeedback, yoga, or meditation will help you relax. Physical activity such as swimming, jogging, or walking also may be helpful.   SEEK MEDICAL CARE IF:   You continue to have a fast heartbeat.    Your palpitations occur more often.   SEEK IMMEDIATE MEDICAL CARE IF :  You develop chest pain, shortness of breath, sever    Diarrhea  Diarrhea is watery poop (stool). The most common cause of diarrhea is a virus. Other causes include:   Food  poisoning.    Germ (bacterial) infection.    Reaction to medicine.   HOME CARE   Drink clear fluids. This can stop you from losing too much fluid (dehydration).    Drink enough water and fluids to keep the pee clear or pale yellow.    Avoid solid foods and dairy products until you or your child feels better. Then start eating bland foods, such as:    Bananas.    Rice.  Crackers.      Applesauce.    Dry toast.         Avoid spicy foods, caffeine, and alcohol.    Your doctor may give medicine to help with cramps and watery poop. Take this only as you are told. Avoid these medicines if you or your child has a fever or blood in the poop.    Your doctor may give medicines (antibiotics) that kill germs. These are given if the watery poop is caused by germs. Take these only as you are told.   GET HELP IF:   The watery poop lasts longer than 3 days.    You or your child has a temperature by mouth above 102 F (38.9 C).    Your baby is older than 3 months with a rectal temperature of 100.5 F (38.1 C) or higher for more than 1 day.    There is blood in the poop.    You or your child starts throwing up (vomiting).    You or your child loses too much fluid.   MAKE SURE YOU:   Understand these instructions.    Will watch this condition.    Will get help right away if you or your child is not doing well or gets worse.   Document Released: 03/25/2009    [facility] Patient Information 2012 [place], [place].    e headache, dizziness, or fainting.  Document Released: 12/27/1999 Document Re-Released: 01/20/2009  [facility] Patient Information 2012 [place], [place].

## 2010-07-19 NOTE — ED Provider Notes (Signed)
Patient states that she has vomiting and diarrhea and palpatations with her migraine since 0600 this morning. Has had 4 episodes of vomiting and 3 of diarrhea.  No fever. Pain is on the left side of her head which is typical of her migraines. Denies having palpatations with migraine before. Continuous palpations. No chest or abdominal pain. Patient has been to ED for migraines before, most recently a couple months ago. Was given Zofran and Dilaudid or phenergan. Normally takes Imitrex at home. It did not work today.   Patient is a 35 y.o. female presenting with headaches. The history is provided by the patient.   Headache  The primary symptoms include headaches, nausea and vomiting. Primary symptoms do not include fever. The symptoms began 2 to 6 hours ago. The symptoms are unchanged.   The headache began today. Headache is a recurrent problem. Location/region(s) of the headache: left unilateral.   Nausea began today.   The vomiting began today. Vomiting occurs 2 to 5 times per day.         PAST MEDICAL HISTORY   has a past medical history of Migraine and Brain tumor.    PAST SURGICAL HISTORY   has past surgical history that includes Cesarean section and brain surgery.    FAMILY HISTORY  family history is not on file.    SOCIAL HISTORY   reports that she has never smoked. She does not have any smokeless tobacco history on file. She reports that she does not drink alcohol or use illicit drugs.    HOME MEDICATIONS     Prior to Admission medications    Medication Sig Start Date End Date Taking? Authorizing Provider   ondansetron (ZOFRAN ODT) 4 MG disintegrating tablet Take 1 tablet by mouth every 8 hours as needed for Nausea for 10 doses. 02/28/10   Army Chaco, NP   oxycodone-acetaminophen (PERCOCET) 5-325 MG per tablet Take 1 tablet by mouth every 4 hours as needed.    Historical Provider, MD   therapeutic multivitamin-minerals (THERAGRAN-M) tablet Take 1 tablet by mouth daily.    Historical Provider, MD         ALLERGIES   has no known allergies.     Review of Systems   Constitutional: Negative for fever.   Gastrointestinal: Positive for nausea, vomiting and diarrhea.   Neurological: Positive for headaches.       Physical Exam   Constitutional: She is oriented to person, place, and time. She appears well-developed and well-nourished.   HENT:   Head: Normocephalic and atraumatic.   Mouth/Throat: No oropharyngeal exudate, posterior oropharyngeal edema or posterior oropharyngeal erythema.   Eyes: Conjunctivae and EOM are normal. Pupils are equal, round, and reactive to light.   Neck: Normal range of motion. Neck supple. No JVD present.   Cardiovascular: Normal rate, regular rhythm and intact distal pulses.    Pulmonary/Chest: Effort normal and breath sounds normal. No respiratory distress.   Abdominal: Soft. Bowel sounds are normal. She exhibits no distension and no mass. There is no tenderness. There is no rigidity, no rebound and no guarding.   Musculoskeletal: Normal range of motion. She exhibits no edema.   Neurological: She is alert and oriented to person, place, and time. No cranial nerve deficit or sensory deficit. She exhibits normal muscle tone. Coordination normal.   Skin: Skin is warm and dry. No rash noted.   Psychiatric: She has a normal mood and affect. Her behavior is normal.       Procedures  MDM    Labs  Results for orders placed during the hospital encounter of 07/19/10   CBC WITH AUTO DIFFERENTIAL       Component Value Range    WBC 4.9  4.0 - 11.0 (X 1000)    RBC 4.78  4.0 - 5.2 (X(10)6)    Hemoglobin 14.7  12.0 - 16.0 (gm/dl)    Hematocrit 16.1  09.6 - 48.0 (%)    MCV 88.9  80 - 100 (fl)    MCH 30.8  26 - 34 (pg)    MCHC 34.7  31 - 36 (gm/dl)    RDW 04.5  40.9 - 81.1 (%)    Platelets 215  135 - 450 (X(10)3)    MPV 8.9  5.0 - 10.5 (fl)    Segs Relative 66.6 (*) 42.0 - 63.0 (%)    Lymphocytes Relative 25.7  25.0 - 40.0 (%)    Monocytes Relative 6.3  0.0 - 12.0 (%)    Eosinophils 0.6  0.0 - 5.0 (%)     Basophils Relative 0.8  0.0 - 2.0 (%)    GRANULOCYTE ABSOLUTE COUNT 3.3  1.7 - 7.7 (X(10)3)    Lymphocytes Absolute 1.3  1.0 - 5.1 (X(10)3)    Monocytes Absolute 0.3  0.0 - 1.3 (X(10)3)    Eosinophils Absolute 0.0  0.0 - 0.6 (X(10)3)    Basophils Absolute 0.0  0.0 - 0.2 (X(10)3)    Differential Type Auto     COMPREHENSIVE METABOLIC PANEL       Component Value Range    Sodium 142  136 - 145 (mEq/L)    Potassium 3.5  3.5 - 5.1 (mEq/L)    Chloride 107  99 - 110 (mEq/L)    CO2 25  21 - 32 (mEq/L)    Anion Gap 10.0      Glucose 119 (*) 70 - 99 (mg/dl)    BUN 10  7 - 18 (mg/dl)    Creatinine, Ser 0.9  0.6 - 1.1 (mg/dl)    Total Protein 7.7  6.4 - 8.2 (gm/dl)    Alb 4.6  3.4 - 5.0 (gm/dl)    Albumin/Globulin Ratio 1.5  1.1 - 2.2     Total Bilirubin 0.71  0.0 - 1.0 (mg/dl)    Alkaline Phosphatase 47  45 - 129 (U/L)    AST 31  15 - 37 (U/L)    ALT 27  10 - 40 (U/L)    Calcium 10.0  8.3 - 10.6 (mg/dl)    GFR, Estimated >91  >60 mL/min/1.68m2     GFR Est, African/Amer >60  SEE BELOW            EKG Interpretation.  EKG interpreted by Arman Filter, MD:    Rhythm: normal sinus   Rate: 96  Axis: normal  Ectopy: none  Conduction: normal  ST Segments: no acute change  T Waves: no acute change  Q Waves: none    10:30 AM  Patient states she is no longer nauseous. Requests more pain medication. Headache is better. Discussed results, diagnosis and plan with patient and/or family.  Questions addressed.  Disposition and follow-up agreed upon.  Specific discharge instructions explained, including reasons to return to the emergency department.    I estimate there is LOW risk for SUBARACHNOID HEMORRHAGE, MENINGITIS, INTRACRANIAL HEMORRHAGE, SUBDURAL OR EPIDURAL HEMATOMA, OR STROKE, thus I consider the discharge disposition reasonable. The patient and/or family and I have discussed the diagnosis and risks, and we agree with discharging  home to follow-up with their primary doctor. We also discussed returning to the Emergency Department  immediately if new or worsening symptoms occur. We have discussed the symptoms which are most concerning (e.g., changing or worsening pain, weakness, vomiting, fever) that necessitate immediate return.      Scribe Authentication: All medical record entries made by the scribe were at my direction. I have reviewed the chart and agree that the record accurately reflects the my work and the decisions made by me, Arman Filter, MD    All entries by Tennis Must are made while acting as a scribe for Arman Filter, MD.      Arman Filter, MD  07/19/10 1039

## 2010-07-19 NOTE — ED Notes (Signed)
Paitent resting comfortably on left side on ed cot, Currently her pain is 5/10. Dr Rexene Agent speaking with patient about her results and plan of care.     Maylon Cos, RN  07/19/10 1043

## 2010-07-19 NOTE — ED Notes (Signed)
Warm blankets x 2 given at patients request. Marlis Edelson, ED Tech at bedside preparing to perform EKG.     Maylon Cos, RN  07/19/10 [phone]

## 2010-12-17 ENCOUNTER — Inpatient Hospital Stay
Admit: 2010-12-17 | Discharge: 2010-12-17 | Disposition: A | Discharge status: Home / Self Care | Attending: Emergency Medicine

## 2010-12-17 MED ORDER — LORAZEPAM 1 MG PO TABS
1 MG | Freq: Once | ORAL | Status: AC
Start: 2010-12-17 — End: 2010-12-17
  Administered 2010-12-17: 10:00:00 via ORAL

## 2010-12-17 MED ORDER — ONDANSETRON 4 MG PO TBDP
4 MG | ORAL_TABLET | Freq: Three times a day (TID) | ORAL | Status: DC | PRN
Start: 2010-12-17 — End: 2011-03-11

## 2010-12-17 MED ORDER — ONDANSETRON 4 MG PO TBDP
4 MG | Freq: Once | ORAL | Status: AC
Start: 2010-12-17 — End: 2010-12-17
  Administered 2010-12-17: 10:00:00 via ORAL

## 2010-12-17 MED ORDER — TRAZODONE HCL 50 MG PO TABS
50 MG | ORAL_TABLET | Freq: Every evening | ORAL | Status: AC
Start: 2010-12-17 — End: 2010-12-22

## 2010-12-17 MED ORDER — INDOMETHACIN 50 MG PO CAPS
50 MG | ORAL_CAPSULE | Freq: Three times a day (TID) | ORAL | Status: AC
Start: 2010-12-17 — End: 2010-12-24

## 2010-12-17 MED ORDER — KETOROLAC TROMETHAMINE 60 MG/2ML IJ SOLN
60 MG/2ML | Freq: Once | INTRAMUSCULAR | Status: AC
Start: 2010-12-17 — End: 2010-12-17
  Administered 2010-12-17: 10:00:00 via INTRAMUSCULAR

## 2010-12-17 MED FILL — KETOROLAC TROMETHAMINE 60 MG/2ML IJ SOLN: 60 MG/2ML | INTRAMUSCULAR | Qty: 2

## 2010-12-17 MED FILL — LORAZEPAM 1 MG PO TABS: 1 mg | ORAL | Qty: 2

## 2010-12-17 MED FILL — ONDANSETRON 4 MG PO TBDP: 4 mg | ORAL | Qty: 1

## 2010-12-17 NOTE — Discharge Instructions (Signed)
IMPORTANT:  If you have any trouble getting in to see the physician that we have referred you to today, please call the [facility] at [phone].  Please leave a voice message if they are unavailable and they will return your call.    If you were prescribed an outpatient test, please call [facility] at [phone] to schedule an appointment for your test that was ordered.         DIAGNOSIS:  Diagnoses of Headache and Chronic migraine were pertinent to this visit.      ADDITIONAL INSTRUCTIONS FOR ALL PATIENTS:  -If you have been prescribed an antibiotic TAKE IT AS DIRECTED UNTIL IT IS ALL FINISHED.  -If you HAVE RECEIVED OR BEEN PRESCRIBED A MEDICATION THAT MAY CAUSE DROWSINESS. DO NOT DRIVE, DRINK ALCOHOL, OR OPERATE MACHINERY THAT REQUIRES YOU TO BE ALERT.    -If you had an EKG and/or X-Ray reading made in the Emergency Department, it will be reviewed by a Cardiologist and/or Radiologist. If the review changes your diagnosis, you will be contacted.  -If you had a specimen collected for culture, a CULTURE REPORT takes 48-72 hours to generate: You will be contacted if a change in treatment is needed.    -Return if your condition worsens or if you have severe pain, worsening of symptoms such as fever, vomiting or difficulty breathing.          Migraine Headache, Recurrent    You have a recurrent migraine headache. The caregiver can usually provide good relief for this headache. If this headache is the same as your previous migraine headaches, it is safe to treat you without repeating a complete evaluation.    These headaches usually have at least two of the following problems:     They occur on one side of the head, pulsate, and are severe enough to prevent daily activities.    They are aggravated by daily physical activities.   You may have one or more of the following symptoms:     Nausea (feeling sick to your stomach).    Vomiting.    Pain with exposure to bright lights or  loud noises.   Most headache sufferers have a family history of migraines. Your headaches may also be related to alcohol and smoking habits. Too much sleep, too little sleep, mood, and anxiety may also play a part. Changing some of these triggers may help you lower the number and level of pain of the headaches. Headaches may be related to menses (female menstruation).  There are numerous medications that can prevent these headaches. Your caregiver can help you with a medication or regimen (procedure to follow). If this has been a chronic (long-term) condition, the use of long-term narcotics is not recommended. Using long-term narcotics can cause recurrent migraines. Narcotics are only a temporary measure only. They are used for the infrequent migraine that fails to respond to all other measures.  SEEK MEDICAL CARE IF:   You do not get relief from the medications given to you.    You have a recurrence of pain.    This headache begins to differ from past migraine (for example if it is more severe).   SEEK IMMEDIATE MEDICAL CARE IF YOU HAVE:     An unexplained oral temperature above 102 F (38.9 C), or as your caregiver suggests.    A stiff neck.    Vision loss or have changes in vision.    Problems with feeling lightheaded, become faint, or lose  your balance.    Muscular weakness.    Loss of muscular control.    You develop severe symptoms different from your first symptoms.    You start losing your balance or have trouble walking.    You feel faint or pass out.   MAKE SURE YOU:     Understand these instructions.    Will watch your condition.    Will get help right away if you are not doing well or get worse.   Document Released: 09/23/2000 Document Re-Released: 12/12/2007  [facility] Patient Information 2012 [place], [place].

## 2010-12-17 NOTE — ED Provider Notes (Signed)
Patient presents with severe headache. Pt has hx of Migraines. Currently on antibiotic for sinus issues. States that the headaches are worst with her sinus issues and around her menstrual cycle.  Patient states that she is nauseated and has had dry heavesand unable to sleep. Patient states she has taken vicodin, imitrex, and phenergan for these symptoms without any relief. Patient has had multiple emergency department visits for migraine headaches generally treated with Dilaudid.last of these was 07-19-10    PAST MEDICAL HISTORY   has a past medical history of Migraine and Brain tumor.    PAST SURGICAL HISTORY   has past surgical history that includes Cesarean section.        SOCIAL HISTORY   reports that she has never smoked. She has never used smokeless tobacco. She reports that she drinks alcohol. She reports that she does not use illicit drugs.    HOME MEDICATIONS     Prior to Admission medications    Medication Sig Start Date End Date Taking? Authorizing Provider   SUMAtriptan (IMITREX) 100 MG tablet Take 100 mg by mouth once as needed.      Historical Provider, MD   HYDROcodone-acetaminophen (VICODIN) 5-500 MG per tablet Take 1 tablet by mouth every 6 hours as needed.      Historical Provider, MD        ALLERGIES  is allergic to latex.     Patient is a 35 y.o. female presenting with headaches. The history is provided by the patient.   Headache  The primary symptoms include headaches and nausea. Primary symptoms do not include syncope, loss of consciousness, dizziness, visual change, paresthesias, fever or vomiting. The symptoms began yesterday. The symptoms are unchanged. The neurological symptoms are focal (left facial and temporal is usual for her chronic migraine).   The headache began more than 2 days ago. The headache developed gradually. Headache is a recurrent problem. The headache is present frequently. Location/region(s) of the headache: temporal, bilateral and frontal. The headache is associated with  photophobia and neck stiffness. The headache is not associated with visual change, paresthesias, weakness or loss of balance.   Additional symptoms include neck stiffness, pain, photophobia, anxiety, irritability and dysphoric mood. Additional symptoms do not include weakness, loss of balance or vertigo. Medical issues do not include hypertension. Workup history includes CT scan.       Review of Systems   Constitutional: Positive for irritability. Negative for fever.   HENT: Positive for congestion, neck stiffness, postnasal drip and sinus pressure.    Eyes: Positive for photophobia.   Respiratory: Negative for shortness of breath.    Cardiovascular: Negative for chest pain, palpitations, leg swelling and syncope.   Gastrointestinal: Positive for nausea. Negative for vomiting and diarrhea.   Genitourinary: Negative for dysuria, hematuria and menstrual problem.   Musculoskeletal: Negative for back pain.   Skin: Negative for rash.   Neurological: Positive for headaches. Negative for dizziness, vertigo, loss of consciousness, weakness, light-headedness, paresthesias and loss of balance.   Psychiatric/Behavioral: Positive for dysphoric mood. The patient is nervous/anxious.        Physical Exam   Nursing note and vitals reviewed.  Constitutional: She is oriented to person, place, and time. She appears well-developed and well-nourished.  Non-toxic appearance. She does not have a sickly appearance. She does not appear ill. No distress.   HENT:   Head: Normocephalic and atraumatic.   Right Ear: Tympanic membrane normal.   Left Ear: Tympanic membrane normal.   Mouth/Throat: No oropharyngeal  exudate, posterior oropharyngeal edema or posterior oropharyngeal erythema.   Eyes: Conjunctivae and EOM are normal. Pupils are equal, round, and reactive to light. Right eye exhibits no chemosis. Left eye exhibits no chemosis. Right eye exhibits normal extraocular motion. Left eye exhibits normal extraocular motion.   Fundoscopic exam:        The right eye shows no AV nicking and no papilledema. The right eye shows red reflex.       The left eye shows no AV nicking and no papilledema. The left eye shows red reflex.  Neck: Normal range of motion. Neck supple. No JVD present.   Cardiovascular: Normal rate, regular rhythm and intact distal pulses.    Pulmonary/Chest: Effort normal and breath sounds normal. No respiratory distress.   Abdominal: Soft. Bowel sounds are normal. She exhibits no distension and no mass. There is no tenderness. There is no rigidity, no rebound and no guarding.   Musculoskeletal: Normal range of motion. She exhibits no edema.   Neurological: She is alert and oriented to person, place, and time. She has normal strength and normal reflexes. She displays no tremor. No cranial nerve deficit or sensory deficit. She exhibits normal muscle tone. Coordination and gait normal. GCS eye subscore is 4. GCS verbal subscore is 5. GCS motor subscore is 6.   Skin: Skin is warm and dry. No rash noted.   Psychiatric: Her behavior is normal. Her mood appears anxious. Her affect is angry and labile.       Procedures    MDM  Number of Diagnoses or Management Options  Anger reaction:   Chronic migraine:   Headache:      Amount and/or Complexity of Data Reviewed  Decide to obtain previous medical records or to obtain history from someone other than the patient: yes  Review and summarize past medical records: yes                Emergency Department Course    I discussed the State Guidelines for Opiate and Controlled substances in detail w/ this patient. We discussed that I will be following these guidelines and therefore providing no sedatives or any other scheduled substances and non narcotic pain management only.    I advised the patient that under the [facility] guidelines, I am not to give opiate medication under these circumstances.she became angry and indicated that all that Toradol gives her partial relief she states that she just needs to sleep.   I talked about giving her some dose of benzodiazepine here to try to help her with that.  She continued to be angry and upset and indicated that she usually gets a shot of Dilaudid.      All entries by Dionne Ano are made while acting as a scribe for Rory Percy, MD.  Scribe Authentication: All medical record entries made by the scribe were at my direction. I have reviewed the chart and agree that the record accurately reflects the my work and the decisions made by me, Rory Percy, MD      Rory Percy, MD  12/17/10 [phone]

## 2011-02-11 MED ORDER — IOHEXOL 240 MG/ML IV SOLN
240 MG/ML | Freq: Once | INTRAVENOUS | Status: AC | PRN
Start: 2011-02-11 — End: 2011-02-11
  Administered 2011-02-11: 16:00:00

## 2011-03-11 ENCOUNTER — Inpatient Hospital Stay
Admit: 2011-03-11 | Discharge: 2011-03-11 | Disposition: A | Discharge status: Home / Self Care | Attending: Emergency Medicine

## 2011-03-11 LAB — CBC WITH AUTO DIFFERENTIAL
Basophils %: 0.4 % (ref 0.0–2.0)
Basophils Absolute: 0 10*3 (ref 0.0–0.2)
Eosinophils %: 0.1 % (ref 0.0–5.0)
Eosinophils Absolute: 0 10*3 (ref 0.0–0.6)
Granulocyte Absolute Count: 4.9 10*3 (ref 1.7–7.7)
Hematocrit: 43.5 % (ref 36.0–48.0)
Hemoglobin: 14.5 gm/dl (ref 12.0–16.0)
Lymphocytes %: 11.2 % — ABNORMAL LOW (ref 25.0–40.0)
Lymphocytes Absolute: 0.7 10*3 — ABNORMAL LOW (ref 1.0–5.1)
MCH: 30.5 pg (ref 26–34)
MCHC: 33.3 gm/dl (ref 31–36)
MCV: 91.5 fl (ref 80–100)
MPV: 9.1 fl (ref 5.0–10.5)
Monocytes %: 4.3 % (ref 0.0–12.0)
Monocytes Absolute: 0.3 10*3 (ref 0.0–1.3)
Platelets: 194 10*3 (ref 135–450)
RBC: 4.75 10*6 (ref 4.0–5.2)
RDW: 13.3 % (ref 11.5–14.5)
Segs Relative: 84 % — ABNORMAL HIGH (ref 42.0–63.0)
WBC: 5.9 10*3 (ref 4.0–11.0)

## 2011-03-11 LAB — POC URINALYSIS
Bilirubin, UA: NEGATIVE
Blood, UA POC: NEGATIVE
Glucose, UA POC: NEGATIVE
Ketones, UA: NEGATIVE
Leukocytes, UA: NEGATIVE
Nitrite, UA: NEGATIVE
Protein, UA POC: NEGATIVE
Spec Grav, UA: 1.015
Urobilinogen, UA: 0.2
pH, UA: 8

## 2011-03-11 LAB — COMPREHENSIVE METABOLIC PANEL
ALT: 20 U/L (ref 10–40)
AST: 29 U/L (ref 15–37)
Albumin: 4.5 gm/dl (ref 3.4–5.0)
Alkaline Phosphatase: 37 U/L — ABNORMAL LOW (ref 45–129)
BUN: 9 mg/dl (ref 7–18)
CO2: 25 mEq/L (ref 21–32)
Calcium: 9.2 mg/dl (ref 8.3–10.6)
Chloride: 109 mEq/L (ref 99–110)
Creatinine: 0.8 mg/dl (ref 0.6–1.1)
GFR Est, African/Amer: >60
GFR, Estimated: >60 (ref >60)
Glucose: 92 mg/dl (ref 70–99)
Potassium: 5 mEq/L (ref 3.5–5.1)
Sodium: 140 mEq/L (ref 136–145)
Total Bilirubin: 1.65 mg/dl — ABNORMAL HIGH (ref 0.0–1.0)
Total Protein: 7.5 gm/dl (ref 6.4–8.2)

## 2011-03-11 LAB — POC PREGNANCY UR-QUAL: Preg Test, Ur: POSITIVE

## 2011-03-11 MED ORDER — ONDANSETRON 4 MG PO TBDP
4 MG | Freq: Once | ORAL | Status: AC
Start: 2011-03-11 — End: 2011-03-11
  Administered 2011-03-11: 17:00:00 via ORAL

## 2011-03-11 MED ORDER — METOCLOPRAMIDE HCL 5 MG/ML IJ SOLN
5 MG/ML | Freq: Once | INTRAMUSCULAR | Status: AC
Start: 2011-03-11 — End: 2011-03-11
  Administered 2011-03-11: 15:00:00 via INTRAVENOUS

## 2011-03-11 MED ORDER — ONDANSETRON 4 MG PO TBDP
4 MG | ORAL | Status: DC
Start: 2011-03-11 — End: 2011-03-11

## 2011-03-11 MED ORDER — MORPHINE SULFATE 10 MG/ML IJ SOLN
10 MG/ML | INTRAMUSCULAR | Status: DC | PRN
Start: 2011-03-11 — End: 2011-03-11
  Administered 2011-03-11: 17:00:00 via INTRAMUSCULAR

## 2011-03-11 MED ORDER — SODIUM CHLORIDE 0.9 % IV BOLUS
0.9 % | Freq: Once | INTRAVENOUS | Status: AC
Start: 2011-03-11 — End: 2011-03-11
  Administered 2011-03-11: 15:00:00 via INTRAVENOUS

## 2011-03-11 MED ORDER — DIPHENHYDRAMINE HCL 50 MG/ML IJ SOLN
50 MG/ML | Freq: Once | INTRAMUSCULAR | Status: AC
Start: 2011-03-11 — End: 2011-03-11
  Administered 2011-03-11: 15:00:00 via INTRAVENOUS

## 2011-03-11 MED FILL — ONDANSETRON 4 MG PO TBDP: 4 MG | ORAL | Qty: 1

## 2011-03-11 MED FILL — METOCLOPRAMIDE HCL 5 MG/ML IJ SOLN: 5 mg/mL | INTRAMUSCULAR | Qty: 2

## 2011-03-11 MED FILL — MORPHINE SULFATE 10 MG/ML IJ SOLN: 10 mg/mL | INTRAMUSCULAR | Qty: 1

## 2011-03-11 MED FILL — DIPHENHYDRAMINE HCL 50 MG/ML IJ SOLN: 50 mg/mL | INTRAMUSCULAR | Qty: 1

## 2011-03-11 MED FILL — SODIUM CHLORIDE 0.9 % IV SOLN: 0.9 % | INTRAVENOUS | Qty: 1000

## 2011-03-11 NOTE — ED Notes (Signed)
Report per Kennyth Lose. Patient states pain is gone. Requests something for nausea. Zofran ODT given. Had pt call for ride home.    Isac Caddy, RN  03/11/11 [phone]

## 2011-03-11 NOTE — ED Notes (Signed)
Pt c/o "feeling nauseated".  Will make Dr. Beola Cord aware.      Elberta Leatherwood, RN  03/11/11 1154

## 2011-03-11 NOTE — ED Notes (Signed)
Pt states "I'm really hungry".  Pt requesting something to eat.  Pt given water and crackers at this time.      Elberta Leatherwood, RN  03/11/11 1116

## 2011-03-11 NOTE — Discharge Instructions (Signed)

## 2011-03-11 NOTE — ED Notes (Signed)
Pt c/o migraine HA since "yesterday".  Pt states "I'm five weeks pregnant so I am not able to take any of my medications".  Respirations slow and regular.  Airway intact.  GCS 15.  Awaiting further orders.  Will continue to monitor.      Elberta Leatherwood, RN  03/11/11 1009

## 2011-03-11 NOTE — ED Notes (Signed)
Pt oob ambulated to and from BR.  0 s/s of distress. Pt tolerated well.      Elberta Leatherwood, RN  03/11/11 1104

## 2011-03-11 NOTE — ED Provider Notes (Signed)
Patient Identification  Lacey Shaw is a 36 y.o. female.    Chief Complaint   Migraine and Emesis    Patient presents with a complaint of a migraine headache since yesterday morning.  Patient states her last severe migraine was about a month ago.  She states her headache is 10 out of 10 severity.  She states this is typical migraine for her.  She has had dry heaves.  She denies any trauma or carbon monoxide exposure.  No one else at home is had headaches.  She denies any fevers, chills, cough, chest pain, shortness of breath, abdominal pain.  She states she has had urinary frequency but denies dysuria.  She is [redacted] weeks pregnant and states that she cannot take her Imitrex due to her pregnancy.    Past Medical History   Diagnosis Date   . Migraine    . Brain tumor      non-cancerous     Past Surgical History   Procedure Laterality Date   . Cesarean section       History reviewed. No pertinent family history.  History     Social History   . Marital Status: Single     Spouse Name: N/A     Number of Children: N/A   . Years of Education: N/A     Occupational History   . Not on file.     Social History Main Topics   . Smoking status: Never Smoker    . Smokeless tobacco: Never Used   . Alcohol Use: No   . Drug Use: No   . Sexually Active: Yes -- Female partner(s)     Other Topics Concern   . Not on file     Social History Narrative   . No narrative on file     No current facility-administered medications for this encounter.     Current Outpatient Prescriptions   Medication Sig Dispense Refill   . levothyroxine (SYNTHROID) 25 MCG tablet Take 25 mcg by mouth Daily.       Marland Kitchen EXPIRED: indomethacin (INDOCIN) 50 MG capsule Take 1 capsule by mouth 3 times daily (with meals) for 21 doses.  21 capsule  0   . EXPIRED: traZODone (DESYREL) 50 MG tablet Take 1 tablet by mouth nightly for 5 days.  5 tablet  0   . SUMAtriptan (IMITREX) 100 MG tablet Take 100 mg by mouth once as needed. Pt not taking due to pregnancy       .  HYDROcodone-acetaminophen (VICODIN) 5-500 MG per tablet Take 1 tablet by mouth every 6 hours as needed.         Allergies   Allergen Reactions   . Latex      Nursing Notes Reviewed    REVIEW OF SYSTEMS  10 systems reviewed, pertinent positives per HPI otherwise noted to be negative    PHYSICAL EXAMINATION  GENERAL: Patient is well-developed, well-nourished, and in no acute distress.  VITAL SIGNS: BP 95/54  Pulse 98  Temp(Src) 97.8 F (36.6 C) (Oral)  Resp 16  Ht 5\' 2"  (1.575 m)  Wt 121 lb (54.885 kg)  BMI 22.13 kg/m2  SpO2 100%  LMP 02/03/2011  HEAD: Normocephalic, atraumatic.  HEENT: Eyes - Pupils equal, round and reactive to light. Sclerae anicteric. EOM intact. ENT - Oropharynx is clear without erythema or exudates. Mucous membranes are moist.  NECK: Supple. No adenopathy. Trachea midline. No masses or thyromegaly.  No meningeal signs.  RESPIRATORY: Good respiratory effort.  LUNGS: Clear to auscultation bilaterally.   HEART: Regular rate and rhythm. no murmurs.  ABDOMEN: Soft, nontender and nondistended. Normoactive bowel sounds.   BACK: No CVA or spinal tenderness.   SKIN: No significant rashes or lesions. Warm and dry to palpation.   EXTREMITIES: There is no cyanosis or edema. There is no calf tenderness. Pulses are 2+.   NEUROLOGIC: Motor is 5/5 throughout. Cranial nerves grossly intact.   No pronator or leg drift.  Finger to nose intact.  PSYCHIATRIC: Patient is alert and awake. Judgment appropriate. Answers questions and follows commands appropriately.     Medications   diphenhydrAMINE (BENADRYL) injection 25 mg (25 mg Intravenous Given 03/11/11 1027)   metoclopramide (REGLAN) injection 10 mg (10 mg Intravenous Given 03/11/11 1027)   sodium chloride 0.9% bolus (1,000 mLs Intravenous Given 03/11/11 1027)   ondansetron (ZOFRAN-ODT) disintegrating tablet 4 mg (4 mg Oral Given 03/11/11 1207)       Results for orders placed during the hospital encounter of 03/11/11   CBC WITH AUTO DIFFERENTIAL       Result  Value Range    WBC 5.9  4.0 - 11.0 X 1000    RBC 4.75  4.0 - 5.2 X(10)6    Hemoglobin 14.5  12.0 - 16.0 gm/dl    Hematocrit 33.2  95.1 - 48.0 %    MCV 91.5  80 - 100 fl    MCH 30.5  26 - 34 pg    MCHC 33.3  31 - 36 gm/dl    RDW 88.4  16.6 - 06.3 %    Platelets 194  135 - 450 X(10)3    MPV 9.1  5.0 - 10.5 fl    Segs Relative 84.0 (*) 42.0 - 63.0 %    Lymphocytes Relative 11.2 (*) 25.0 - 40.0 %    Monocytes Relative 4.3  0.0 - 12.0 %    Eosinophils 0.1  0.0 - 5.0 %    Basophils Relative 0.4  0.0 - 2.0 %    GRANULOCYTE ABSOLUTE COUNT 4.9  1.7 - 7.7 X(10)3    Lymphocytes Absolute 0.7 (*) 1.0 - 5.1 X(10)3    Monocytes Absolute 0.3  0.0 - 1.3 X(10)3    Eosinophils Absolute 0.0  0.0 - 0.6 X(10)3    Basophils Absolute 0.0  0.0 - 0.2 X(10)3    Differential Type Auto     COMPREHENSIVE METABOLIC PANEL       Result Value Range    Sodium 140  136 - 145 mEq/L    Potassium 5.0  3.5 - 5.1 mEq/L    Chloride 109  99 - 110 mEq/L    CO2 25  21 - 32 mEq/L    Glucose 92  70 - 99 mg/dl    BUN 9  7 - 18 mg/dl    Creatinine, Ser 0.8  0.6 - 1.1 mg/dl    Total Protein 7.5  6.4 - 8.2 gm/dl    Alb 4.5  3.4 - 5.0 gm/dl    Total Bilirubin 0.16 (*) 0.0 - 1.0 mg/dl    Alkaline Phosphatase 37 (*) 45 - 129 U/L    AST 29  15 - 37 U/L    ALT 20  10 - 40 U/L    Calcium 9.2  8.3 - 10.6 mg/dl    GFR, Estimated >01  >60 mL/min/1.46m2    GFR Est, African/Amer >60  SEE BELOW   POC PREGNANCY UR-QUAL       Result Value Range  Preg Test, Ur positive      QC OK? yes     POC URINALYSIS       Result Value Range    Color, UA        Clarity, UA        Glucose, UA POC neg      Bilirubin, UA neg      Ketones, UA neg      Spec Grav, UA 1.015      Blood, UA POC neg      pH, UA 8.0      Protein, UA POC neg      Urobilinogen, UA 0.2      Leukocytes, UA neg      Nitrite, UA neg      Appearance        QC OK? yes       11:45 AM  On repeat examination, after the Benadryl and Reglan patient states her headache is 7.5/10 in severity.  Patient states that she was still like  something more for pain.  She will be given morphine 5 mg IM.  She is instructed to follow up with her primary care physician for further pain medication.    I estimate there is LOW risk for SUBARACHNOID HEMORRHAGE, MENINGITIS, INTRACRANIAL HEMORRHAGE, SUBDURAL HEMATOMA, OR STROKE, thus I consider the discharge disposition reasonable. Earlie Server Pezzenti and I have discussed the diagnosis and risks, and we agree with discharging home to follow-up with their primary doctor. We also discussed returning to the Emergency Department immediately if new or worsening symptoms occur. We have discussed the symptoms which are most concerning (e.g., changing or worsening pain, weakness, vomiting, fever) that necessitate immediate return.    Clinical Impression    1. Cephalgia        Discharge Vital Signs:  Blood pressure 95/54, pulse 98, temperature 97.8 F (36.6 C), temperature source Oral, resp. rate 16, height 5\' 2"  (1.575 m), weight 121 lb (54.885 kg), last menstrual period 02/03/2011, SpO2 100.00%.        Discharge Medication List as of 03/11/2011 11:46 AM          Chart was created using Dragon voice recognition software.      Gerald Leitz, MD  03/11/11 [phone]

## 2011-04-23 ENCOUNTER — Inpatient Hospital Stay
Admit: 2011-04-23 | Discharge: 2011-04-24 | Disposition: A | Discharge status: Home / Self Care | Attending: Emergency Medicine

## 2011-04-23 MED ORDER — DIPHENHYDRAMINE HCL 50 MG/ML IJ SOLN
50 MG/ML | Freq: Once | INTRAMUSCULAR | Status: AC
Start: 2011-04-23 — End: 2011-04-23
  Administered 2011-04-24: 01:00:00 via INTRAVENOUS

## 2011-04-23 MED ORDER — METOCLOPRAMIDE HCL 5 MG/ML IJ SOLN
5 MG/ML | Freq: Once | INTRAMUSCULAR | Status: AC
Start: 2011-04-23 — End: 2011-04-23
  Administered 2011-04-24: 01:00:00 via INTRAVENOUS

## 2011-04-23 MED ORDER — SODIUM CHLORIDE 0.9 % IV BOLUS
0.9 % | Freq: Once | INTRAVENOUS | Status: AC
Start: 2011-04-23 — End: 2011-04-23
  Administered 2011-04-24: 01:00:00 via INTRAVENOUS

## 2011-04-23 NOTE — ED Notes (Signed)
Went to put IV in patient and told me I had to put it in her Left AC.  "Its my best one but I'm dehydrated so you probably won't get it".  Went to stick patient in left [facility] and patient said "Your up against one of my valves, you have to take it out."  Never had a flash of blood in IV cath.  Took IV out upon patients request.

## 2011-04-23 NOTE — ED Provider Notes (Signed)
Patient Identification  Lacey Shaw is a 36 y.o. female.    Chief Complaint   Migraine      History of Present Illness:    This is a  36 y.o. female who presents ambulatory  to the ED with complaints of a migraine headache for last few days. It was not sudden onset and is similar to prior migraines. She has photophobia but no other visual changes. No fever. No sinus congestion, sore throat, cough. No neck stiffness. She has nausea with occasional emesis. No numbness or weakness. She used to take triptans for her migraines but cannot take them since she is pregnant.     Past Medical History   Diagnosis Date   . Migraine    . Brain tumor      non-cancerous       Past Surgical History   Procedure Laterality Date   . Cesarean section         No current facility-administered medications for this encounter.  Current outpatient prescriptions:oxyCODONE-acetaminophen (PERCOCET) 5-325 MG per tablet, Take 1 tablet by mouth every 4 hours as needed., Disp: , Rfl: ;  metoclopramide (REGLAN) 10 MG tablet, Take 1 tablet by mouth 4 times daily for 10 days., Disp: 15 tablet, Rfl: 0;  levothyroxine (SYNTHROID) 25 MCG tablet, Take 25 mcg by mouth Daily., Disp: , Rfl:   HYDROcodone-acetaminophen (VICODIN) 5-500 MG per tablet, Take 1 tablet by mouth every 6 hours as needed., Disp: , Rfl: ;  EXPIRED: indomethacin (INDOCIN) 50 MG capsule, Take 1 capsule by mouth 3 times daily (with meals) for 21 doses., Disp: 21 capsule, Rfl: 0;  EXPIRED: traZODone (DESYREL) 50 MG tablet, Take 1 tablet by mouth nightly for 5 days., Disp: 5 tablet, Rfl: 0    Allergies   Allergen Reactions   . Latex        History     Social History   . Marital Status: Single     Spouse Name: N/A     Number of Children: N/A   . Years of Education: N/A     Occupational History   . Not on file.     Social History Main Topics   . Smoking status: Never Smoker    . Smokeless tobacco: Never Used   . Alcohol Use: No   . Drug Use: No   . Sexually Active: Yes -- Female partner(s)      Other Topics Concern   . Not on file     Social History Narrative   . No narrative on file       Nursing Notes Reviewed      ROS:  GENERAL:  No fever, no chills, no diaphoresis, + appetite changes  EYES: no eye discharge, no eye redness, no visual changes  ENT: no nasal congestion, no sore throat  CARDIAC: no chest pain,  PULM: no cough, no shortness of breath  ABD: no abdominal pain, + nausea, + vomiting, no diarrhea  GU: no dysuria, no hematuria, no urgency, no frequency. No vaginal bleeding  MUSCULOSKELETAL: no back pain, no arthralgias, no myalgias  NEURO: + headache, no lightheadedness, no dizziness, no numbness, no weakness, no syncope, no confusion  SKIN: no rashes, no erythema, no wounds      PHYSICAL EXAM:  GENERAL APPEARANCE: Lacey Shaw is in no acute respiratory distress. Awake and alert.  VITAL SIGNS: ED Triage Vitals   Enc Vitals Group      BP 04/23/11 1712 120/76 mmHg  Pulse 04/23/11 1712 89       Resp 04/23/11 1712 16       Temp 04/23/11 1712 98.1 F (36.7 C)      Temp Source 04/23/11 1712 Oral      SpO2 04/23/11 1712 100 %      Weight 04/23/11 1712 126 lb (57.153 kg)      Height --       Head Cir --       Peak Flow --       Pain Score --       Pain Loc --       Pain Edu? --       Excl. in GC? --      HEAD: Normocephalic, atraumatic.  EYES:  Extraocular muscles are intact. Pupils equal round and reactive to light. Conjunctivas are pink. Negative scleral icterus.   ENT:  Mucous membranes are moist.  Pharynx without erythema or exudates.   NECK: Nontender and supple.  No cervical adenopathy.  CHEST:  Clear to auscultation bilaterally. No rales, rhonchi, or wheezing.  HEART:  Regular rate and regular rhythm. No murmurs. Strong and equal pulses in the upper and lower extremities.   ABDOMEN: Soft,  nondistended, positive bowel sounds. abdomen is nontender. No rebound.  no guarding.  MUSCULOSKELETAL: The calves are nontender to palpation. Active range of motion of the upper and lower  extremities. No edema.  NEUROLOGICAL: Awake, alert and oriented x 3. Power intact in the upper and lower extremities. Sensation is intact to light touch in the upper and lower extremities. Cranial Nerves 2-12 are intact. No truncal ataxia. No dysarthria or aphasia. Normal finger to nose. Patellar DTRs 2plus  DERMATOLOGIC: No petechiae, rashes, or ecchymoses. No erythema.   PSYCH: normal mood and affect. Normal thought content.      ED COURSE AND MEDICAL DECISION MAKING:    Labs:  Results for orders placed during the hospital encounter of 04/23/11   POC URINALYSIS       Result Value Range    Color, UA        Clarity, UA        Glucose, UA POC neg      Bilirubin, UA neg      Ketones, UA >=160      Spec Grav, UA 1.025      Blood, UA POC neg      pH, UA 5.5      Protein, UA POC neg      Urobilinogen, UA 0.2      Leukocytes, UA neg      Nitrite, UA neg      Appearance        QC OK? y         Treatment in the department:  Patient received the following while in the ED.    Medications   diphenhydrAMINE (BENADRYL) injection 25 mg (25 mg Intravenous Given 04/23/11 2104)   metoclopramide (REGLAN) injection 10 mg (10 mg Intravenous Given 04/23/11 2103)   0.9 % sodium chloride bolus (1,000 mLs Intravenous Given 04/23/11 2104)   HYDROmorphone HCl PF (DILAUDID) injection SOLN 1 mg (1 mg Intravenous Given 04/23/11 2221)         Repeat exam shows patient feeling much better and ready to go home. Encouraged patient to push fluids and some carbohydrates since she has ketonuria    Medical decision making:  Headache no different from usual migraine and is without fever, neuro symptoms or deficits. No need for  CT/LP  I estimate there is LOW risk for SUBARACHNOID HEMORRHAGE, MENINGITIS, INTRACRANIAL HEMORRHAGE, SUBDURAL HEMATOMA, OR STROKE, thus I consider the discharge disposition reasonable. Lacey Shaw and I have discussed the diagnosis and risks, and we agree with discharging home to follow-up with their primary doctor. We also  discussed returning to the Emergency Department immediately if new or worsening symptoms occur. We have discussed the symptoms which are most concerning (e.g., changing or worsening pain, weakness, vomiting, fever, mental status changes, speech difficulty, fainting) that necessitate immediate return.      Clinical Impression:  1. Migraine        Dispo:  Patient will be discharged  at this time. Patient was informed of this decision and agrees with plan. I have discussed lab and xray findings with patient and they understand. Questions were answered to the best of my ability.    Discharge vitals:  Blood pressure 99/46, pulse 78, temperature 98.2 F (36.8 C), temperature source Oral, resp. rate 16, weight 126 lb (57.153 kg), last menstrual period 02/03/2011, SpO2 100.00%.    Prescriptions given:   Discharge Medication List as of 04/23/2011 11:20 PM      START taking these medications    Details   metoclopramide (REGLAN) 10 MG tablet Take 1 tablet by mouth 4 times daily for 10 days., Disp-15 tablet, R-0               This chart was created using Conservation officer, historic buildings.      Loletha Carrow, MD  04/25/11 1730

## 2011-04-23 NOTE — Discharge Instructions (Signed)
Migraine Headache  A migraine headache is an intense, throbbing pain on one or both sides of your head. The exact cause of a migraine headache is not always known. A migraine may be caused when nerves in the brain become irritated and release chemicals that cause swelling within blood vessels, causing pain. Many migraine sufferers have a family history of migraines. Before you get a migraine you may or may not get an aura. An aura is a group of symptoms that can predict the beginning of a migraine. An aura may include:   Visual changes such as:   Flashing lights.   Bright spots or zig-zag lines.   Tunnel vision.   Feelings of numbness.   Trouble talking.   Muscle weakness.  SYMPTOMS   Pain on one or both sides of your head.   Pain that is pulsating or throbbing in nature.   Pain that is severe enough to prevent daily activities.   Pain that is aggravated by any daily physical activity.   Nausea (feeling sick to your stomach), vomiting, or both.   Pain with exposure to bright lights, loud noises, or activity.   General sensitivity to bright lights or loud noises.  MIGRAINE TRIGGERS  Examples of triggers of migraine headaches include:    Alcohol.   Smoking.   Stress.   It may be related to menses (female menstruation).   Aged cheeses.   Foods or drinks that contain nitrates, glutamate, aspartame, or tyramine.   Lack of sleep.   Chocolate.   Caffeine.   Hunger.   Medications such as nitroglycerine (used to treat chest pain), birth control pills, estrogen, and some blood pressure medications.  DIAGNOSIS   A migraine headache is often diagnosed based on:   Symptoms.   Physical examination.   A computerized X-ray scan (computed tomography, CT) of your head.  TREATMENT   Medications can help prevent migraines if they are recurrent or should they become recurrent. Your caregiver can help you with a medication or treatment program that will be helpful to you.   Lying down in a dark, quiet room  may be helpful.   Keeping a headache diary may help you find a trend as to what may be triggering your headaches.  SEEK IMMEDIATE MEDICAL CARE IF:    You have confusion, personality changes or seizures.   You have headaches that wake you from sleep.   You have an increased frequency in your headaches.   You have a stiff neck.   You have a loss of vision.   You have muscle weakness.   You start losing your balance or have trouble walking.   You feel faint or pass out.  MAKE SURE YOU:    Understand these instructions.   Will watch your condition.   Will get help right away if you are not doing well or get worse.  Document Released: 12/29/2004 Document Revised: 12/18/2010 Document Reviewed: 08/14/2008  [facility] Patient Information 2012 [place], [place].      Hyperemesis Gravidarum  Hyperemesis gravidarum is a severe form of nausea and vomiting that happens during pregnancy. Hyperemesis is worse than morning sickness. It may cause a woman to have nausea or vomiting all day for many days. It may keep a woman from eating and drinking enough food and liquids. Hyperemesis usually occurs during the first half (the first 20 weeks) of pregnancy. It often goes away once a woman is in her second half of pregnancy. However, sometimes hyperemesis continues through an  entire pregnancy.    CAUSES    The cause of this condition is not completely known but is thought to be due to changes in the body's hormones when pregnant. It could be the high level of the pregnancy hormone or an increase in estrogen in the body.    SYMPTOMS     Severe nausea and vomiting.   Nausea that does not go away.   Vomiting that does not allow you to keep any food down.   Weight loss and body fluid loss (dehydration).   Having no desire to eat or not liking food you have previously enjoyed.  DIAGNOSIS    Your caregiver may ask you about your symptoms. Your caregiver may also order blood tests and urine tests to make sure something else is not  causing the problem.    TREATMENT    You may only need medicine to control the problem. If medicines do not control the nausea and vomiting, you will be treated in the hospital to prevent dehydration, acidosis, weight loss, and changes in the electrolytes in your body that may harm the unborn baby (fetus). You may need intravenous (IV) fluids.    HOME CARE INSTRUCTIONS     Take all medicine as directed by your caregiver.   Try eating a couple of dry crackers or toast in the morning before getting out of bed.   Avoid foods and smells that upset your stomach.   Avoid fatty and spicy foods. Eat 5 to 6 small meals a day.   Do not drink when eating meals. Drink between meals.   For snacks, eat high protein foods, such as cheese. Eat or suck on things that have ginger in them. Ginger helps nausea.   Avoid food preparation. The smell of food can spoil your appetite.   Avoid iron pills and iron in your multivitamins until after 3 to 4 months of being pregnant.  SEEK MEDICAL CARE IF:     Your abdominal pain increases since the last time you saw your caregiver.   You have a severe headache.   You develop vision problems.   You feel you are losing weight.  SEEK IMMEDIATE MEDICAL CARE IF:     You are unable to keep fluids down.   You vomit blood.   You have constant nausea and vomiting.   You have a fever.   You have excessive weakness, dizziness, fainting, or extreme thirst.  MAKE SURE YOU:     Understand these instructions.   Will watch your condition.   Will get help right away if you are not doing well or get worse.  Document Released: 12/29/2004 Document Revised: 12/18/2010 Document Reviewed: 03/31/2010  [facility] Patient Information 2012 [place], [place].

## 2011-04-24 LAB — POC URINALYSIS
Bilirubin, UA: NEGATIVE
Blood, UA POC: NEGATIVE
Glucose, UA POC: NEGATIVE
Ketones, UA: >=160
Leukocytes, UA: NEGATIVE
Nitrite, UA: NEGATIVE
Protein, UA POC: NEGATIVE
Spec Grav, UA: 1.025
Urobilinogen, UA: 0.2
pH, UA: 5.5

## 2011-04-24 MED ORDER — METOCLOPRAMIDE HCL 10 MG PO TABS
10 MG | ORAL_TABLET | Freq: Four times a day (QID) | ORAL | Status: AC
Start: 2011-04-24 — End: 2011-05-03

## 2011-04-24 MED ORDER — HYDROMORPHONE HCL PF 1 MG/ML IJ SOLN
1 MG/ML | Freq: Once | INTRAMUSCULAR | Status: AC
Start: 2011-04-24 — End: 2011-04-23
  Administered 2011-04-24: 02:00:00 via INTRAVENOUS

## 2011-04-24 MED FILL — SODIUM CHLORIDE 0.9 % IV SOLN: 0.9 % | INTRAVENOUS | Qty: 1000

## 2011-04-24 MED FILL — HYDROMORPHONE HCL PF 1 MG/ML IJ SOLN: 1 MG/ML | INTRAMUSCULAR | Qty: 1

## 2011-04-24 MED FILL — DIPHENHYDRAMINE HCL 50 MG/ML IJ SOLN: 50 mg/mL | INTRAMUSCULAR | Qty: 1

## 2011-04-24 MED FILL — METOCLOPRAMIDE HCL 5 MG/ML IJ SOLN: 5 mg/mL | INTRAMUSCULAR | Qty: 2

## 2012-10-19 ENCOUNTER — Ambulatory Visit: Admit: 2012-10-19 | Payer: PRIVATE HEALTH INSURANCE

## 2012-10-19 DIAGNOSIS — G43009 Migraine without aura, not intractable, without status migrainosus: Secondary | ICD-10-CM | POA: Insufficient documentation

## 2012-10-19 DIAGNOSIS — G43909 Migraine, unspecified, not intractable, without status migrainosus: Secondary | ICD-10-CM

## 2012-10-19 NOTE — Progress Notes (Signed)
 Subjective:    Patient ID: Brittany Rogers is a 37 y.o. female.  HPI  Chronic headaches beginning about 1994 diagnosed as migraine.  No aura, begins cervical region, evolves into left (sometimes right) temporal headache, throbbing, N/V and P/P.  Maximizes in about one hour.  Triggered by alcohol, sleep deprivation,  Foods (nitrates), not sure about MSG.  History of evaluation multiple neurologists.  W/u at [facility] with findings of abnormal left hemisphere lesion, biopsied (needle bx?) and told it was benign.  Has f/u MRIs with most recent one about 2011 and plans for next one about 2016 but patient unsure if she will have it.  No focal symptoms.  Mom with migraine syjptoms.  Treated currently with imitrex which abort the headache nearly all the time, saves SQ imitex for emergencies.  Has tried without success propranolol?, codeine, vitamins, Mg++, Lortab, po Dilaudid.  IV meperedine plus phenergan or IV dilaudid has helped in past when ER necessary.    Concerned now that she will experience severe headaches when she  Becomes pregnant.  Is wanting to become pregnant for the 3rd time and experienced severe headaches during pregnancy with last 2.  Currently plans to stop Imitrex.  Avoids sleep deprivation.  Exercises regularly, likes to keep fit.  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        Histories:   She has a past medical history of Anxiety; Headache(784.0); and Bronchitis.  She has past surgical history that includes Brain surgery; Cesarean section (2010, 2013); and augmentation breast endoscopic (2010).  Her family history includes Hyperlipidemia in her mother and Migraines in her mother.  She reports that she has never smoked. She does not have any smokeless tobacco history on file. She reports that  drinks alcohol.   Review of Systems  Allergies:  Latex, natural rubber  Medications:   Outpatient Encounter Prescriptions as of 10/19/2012   Medication Sig Dispense Refill  . levothyroxine (SYNTHROID, LEVOTHROID) 50 MCG tablet Take 50 mcg by mouth daily.      . sumatriptan (IMITREX STATDOSE PEN) 6 mg/0.5 mL kit Inject 6 mg subcutaneously once as needed for Migraine.      . SUMAtriptan (IMITREX) 100 MG tablet Take 100 mg by mouth once as needed for Migraine.       No facility-administered encounter medications on file as of 10/19/2012.     Objective:    Blood pressure 100/82, pulse 80, height 5' 2 (1.575 m), weight 125 lb (56.7 kg).  Neurologic Exam Alert, oriented, memory and reliability judged good.  She is polite and cooperative.   Speech fluent, language normal PERRL  EOMs full, VFs full, VA a20/20 ou w/ contacts using a pocketcard Discs sharp V motor normal, normal facial sensation No facial weakness Hearing judged normal by finger rub Tongue midline, palate elevates symmetrically sternoclidomastoid and trapezius strength normal  good strength both UE/LEs, normal tone.   Fine motor movement and coordination normal.   No tremor with hands outstretched.   Can stand from a seated position w/o difficulty and walk down the hall and back again normally. 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  She will try to identify/modify precipitating factors to reduce headache frequency.  Discussed pharmacologic options for migraine and pregnancy.  Would avoid prophylactic drugs if possible.  Another trial of propranolol  might be considered but only if her Obstetrician would agree.  Would start at 20 mg bid and slowly titrate.  For abortive therapy during pregnancy, consider tylenol  + caffeine + phenergan, ibuprofen, tylenol +codeine + phenergan, any taken immediately with the onset of the headache.  With next severe headache, consider a trial of oxygen 100% FiO2 via nonrebreathing facemask for 15 minutes.   If not helpful, abandon the idea.  If successful, consider home oxygen for prn use.    F/U in 1-2 months.  Bring outside films for review and, if possible, the biopsy report from the [facility].  Consider repeating MRI brain for assessment historical remark of abnormal left hemisphere findings.   --A.Jaycee

## 2012-10-19 NOTE — Unmapped (Signed)
Bring MRI and [facility] records at next visit.

## 2012-10-19 NOTE — Unmapped (Signed)
Subjective:      Patient ID: Brandy Roy is a 37 y.o. female.    HPI   Chronic headaches beginning about [address]45 diagnosed as migraine.  No aura, begins cervical region, evolves into left (sometimes right) temporal headache, throbbing, N/V and P/P.  Maximizes in about one hour.  Triggered by alcohol, sleep deprivation,  Foods (nitrates), not sure about MSG.  History of evaluation multiple neurologists.  W/u at [facility] with findings of abnormal left hemisphere lesion, biopsied (needle bx?) and told it was benign.  Has f/u MRIs with most recent one about 2011 and plans for next one about 2016 but patient unsure if she will have it.  No focal symptoms.  Mom with migraine syjptoms.    Treated currently with imitrex which abort the headache nearly all the time, saves SQ imitex for emergencies.  Has tried without success propranolol?, codeine, vitamins, Mg++, Lortab, po Dilaudid.  IV meperedine plus phenergan or IV dilaudid has helped in past when ER necessary.    Concerned now that she will experience severe headaches when she  Becomes pregnant.  Is wanting to become pregnant for the 3rd time and experienced severe headaches during pregnancy with last 2.  Currently plans to stop Imitrex.    Avoids sleep deprivation.  Exercises regularly, likes to keep fit.  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              Histories:     She has a past medical history of Anxiety; Headache(784.0); and Bronchitis.    She has past surgical history that includes Brain surgery; Cesarean section (2010, 2013); and augmentation breast endoscopic (2010).    Her family history includes Hyperlipidemia in her mother and Migraines in her mother.    She reports that she has never smoked. She does not have any smokeless tobacco history on file. She reports that  drinks alcohol.      Review of Systems    Allergies:   Latex, natural rubber    Medications:     Outpatient Encounter Prescriptions as of 10/19/2012    Medication Sig Dispense Refill   ??? levothyroxine (SYNTHROID, LEVOTHROID) 50 MCG tablet Take 50 mcg by mouth daily.       ??? sumatriptan (IMITREX STATDOSE PEN) 6 mg/0.5 mL kit Inject 6 mg subcutaneously once as needed for Migraine.       ??? SUMAtriptan (IMITREX) 100 MG tablet Take 100 mg by mouth once as needed for Migraine.         No facility-administered encounter medications on file as of 10/19/2012.        Objective:       Blood pressure 100/82, pulse 80, height 5' 2 (1.575 m), weight 125 lb (56.7 kg).    Neurologic Exam Alert, oriented, memory and reliability judged good.   She is polite and cooperative.    Speech fluent, language normal  PERRL   EOMs full, VFs full, VA a20/20 ou w/ contacts using a pocketcard  Discs sharp  V motor normal, normal facial sensation  No facial weakness  Hearing judged normal by finger rub  Tongue midline, palate elevates symmetrically  sternoclidomastoid and trapezius strength normal   good strength both UE/LEs, normal tone.    Fine motor movement and coordination normal.    No tremor with hands outstretched.    Can stand from a seated position w/o difficulty and walk down the hall and back again normally.  Tandem judged fair to  good  DTRs UE/LE symmetrical, not particularly brisk  Plantar responses flexor on the right, equivocal on the left       Physical Exam       Prior Diagnostic Testing:   Did not bring outside films with her today       Assessment:   Chronic recurrent headaches, onset about 1994 consistent with migraine w/o aura  History of left cerebral lesion s/p biopsy 2002:  Etiology unknown  Desire for 3rd pregnancy    Plan:     Reviewd factors which influence headache frequency including those related to diet, stress, sleep and exercise.  She will try to identify/modify precipitating factors to reduce headache frequency.    Discussed pharmacologic options for migraine and pregnancy.  Would avoid prophylactic drugs if possible.  Another trial of propranolol might be  considered but only if her Obstetrician would agree.  Would start at 20 mg bid and slowly titrate.    For abortive therapy during pregnancy, consider tylenol + caffeine + phenergan, ibuprofen, tylenol+codeine + phenergan, any taken immediately with the onset of the headache.  With next severe headache, consider a trial of oxygen 100% FiO2 via nonrebreathing facemask for 15 minutes.   If not helpful, abandon the idea.  If successful, consider home oxygen for prn use.      F/U in 1-2 months.  Bring outside films for review and, if possible, the biopsy report from the [facility].  Consider repeating MRI brain for assessment historical remark of abnormal left hemisphere findings.      --A.Georgina Pillion

## 2012-10-19 NOTE — Unmapped (Signed)
Subjective:      Patient ID: Brandy Roy is a 37 y.o. female.    HPI   Chronic headaches beginning about 1994 diagnosed as migraine.  No aura, begins cervical region, evolves into left (sometimes right) temporal headache, throbbing, N/V and P/P.  Maximizes in about one hour.  Triggered by alcohol, sleep deprivation,  Foods (nitrates), not sure about MSG.  History of evaluation multiple neurologists.  W/u at [facility] with findings of abnormal left hemisphere lesion, biopsied (needle bx?) and told it was benign.  Has f/u MRIs with most recent one about 2011 and plans for next one about 2016 but patient unsure if she will have it.  No focal symptoms.  Mom with migraine syjptoms.    Treated currently with imitrex which abort the headache nearly all the time, saves SQ imitex for emergencies.  Has tried without success propranolol?, codeine, vitamins, Mg++, Lortab, po Dilaudid.  IV meperedine plus phenergan or IV dilaudid has helped in past when ER necessary.      Concerned now that she will experience severe headaches when she  Becomes pregnant.  Is wanting to become pregnant for the 3rd time and experienced severe headaches during pregnancy with last 2.  Currently plans to stop Imitrex.    Avoids sleep deprivation.  Exercises regularly, likes to keep fit.  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              Histories:     She has a past medical history of Anxiety; Headache(784.0); and Bronchitis.    She has past surgical history that includes Brain surgery; Cesarean section (2010, 2013); and augmentation breast endoscopic (2010).    Her family history includes Hyperlipidemia in her mother and Migraines in her mother.    She reports that she has never smoked. She does not have any smokeless tobacco history on file. She reports that  drinks alcohol.      Review of Systems    Allergies:   Latex, natural rubber    Medications:     Outpatient Encounter Prescriptions as of 10/19/2012    Medication Sig Dispense Refill   ??? levothyroxine (SYNTHROID, LEVOTHROID) 50 MCG tablet Take 50 mcg by mouth daily.       ??? sumatriptan (IMITREX STATDOSE PEN) 6 mg/0.5 mL kit Inject 6 mg subcutaneously once as needed for Migraine.       ??? SUMAtriptan (IMITREX) 100 MG tablet Take 100 mg by mouth once as needed for Migraine.         No facility-administered encounter medications on file as of 10/19/2012.        Objective:       Blood pressure 100/82, pulse 80, height 5' 2 (1.575 m), weight 125 lb (56.7 kg).    Neurologic Exam Alert, oriented, memory and reliability judged good.   She is polite and cooperative.    Speech fluent, language normal  PERRL   EOMs full, VFs full, VA a20/20 ou w/ contacts using a pocketcard  Discs sharp  V motor normal, normal facial sensation  No facial weakness  Hearing judged normal by finger rub  Tongue midline, palate elevates symmetrically  sternoclidomastoid and trapezius strength normal   good strength both UE/LEs, normal tone.    Fine motor movement and coordination normal.    No tremor with hands outstretched.    Can stand from a seated position w/o difficulty and walk down the hall and back again normally.  Tandem judged  fair to good  DTRs UE/LE symmetrical, not particularly brisk  Plantar responses flexor on the right, equivocal on the left       Physical Exam    See above      Prior Diagnostic Testing:   Did not bring outside films with her today       Assessment:   Chronic recurrent headaches, onset about 1994 consistent with migraine w/o aura  History of left cerebral lesion s/p biopsy 2002:  Etiology unknown  Desire for 3rd pregnancy    Plan:     Reviewd factors which influence headache frequency including those related to diet, stress, sleep and exercise.  She will try to identify/modify precipitating factors to reduce headache frequency.    Discussed pharmacologic options for migraine and pregnancy.  Would avoid prophylactic drugs if possible.  Another trial of propranolol  might be considered but only if her Obstetrician would agree.  Would start at 20 mg bid and slowly titrate.    For abortive therapy during pregnancy, consider tylenol + caffeine + phenergan, ibuprofen, tylenol+codeine + phenergan, any taken immediately with the onset of the headache.  With next severe headache, consider a trial of oxygen 100% FiO2 via nonrebreathing facemask for 15 minutes.   If not helpful, abandon the idea.  If successful, consider home oxygen for prn use.      F/U in 1-2 months.  Bring outside films for review and, if possible, the biopsy report from the [facility].  Consider repeating MRI brain for assessment historical remark of abnormal left hemisphere findings.      --A.Georgina Pillion

## 2014-08-30 DIAGNOSIS — E039 Hypothyroidism, unspecified: Secondary | ICD-10-CM | POA: Insufficient documentation

## 2015-06-22 NOTE — ED Notes (Signed)
Formatting of this note might be different from the original.  Patient with c/o HA, nausea, vomiting. Pt states in town from [place], drank moderate amount ETOH last pm. This am woke up with nausea, vomiting, HA. Hx of migraines, took Imitrex IM x2 without improvement. Given 8 mg zofran ODT from EMS en route, IV est with NACL infusing.   Electronically signed by Myrtie Cruise, RN at 06/22/2015  2:02 PM EDT

## 2015-06-22 NOTE — ED Notes (Signed)
Formatting of this note might be different from the original.  Pt reports c/o migraine headache with hx of same. States was out celebrating with friends last night and was out late and drinking. States started with headache approx 3am. States took 2 imitrex without relief. Pt also states is dehydrated. Pt reports c/o n/v. Pt is aa&o x3. Speech clear and appropriate. Pt appears neuro intact. Denies other sx at this time. resp unlabored.   Electronically signed by Thersa Salt, RN at 06/22/2015  4:09 PM EDT

## 2015-06-22 NOTE — ED Notes (Signed)
Formatting of this note might be different from the original.  Bed: 05  Expected date: 06/22/15  Expected time: 1:43 PM  Means of arrival: Ambulance  Comments:  Medic 801. HA  Electronically signed by Fulton Reek, RN at 06/22/2015  1:54 PM EDT

## 2015-06-22 NOTE — ED Provider Notes (Signed)
Formatting of this note is different from the original.  EMERGENCY DEPARTMENT NOTE    PCP - Physician No  MRN - [id number]    Chief Complaint   Patient presents with   ? Headache   ? Emesis     HPI  Patient is a 40 y.o. female who presents to the Emergency Department for vomiting and headache. I'm dehydrated, I have a migraine, and I'm hungover.    Patient visiting [place] from the [place] area.  Admits that she drank too much alcohol last evening.  Never really went to bed, notes that she began feeling nauseous around 230 or 3 AM.  Is had multiple episodes of nonbilious nonbloody emesis.  He has developed a headache, diffuse, similar to her prior migraines.  She typically uses Imitrex for this.  Has used her Imitrex I am twice without improvement of her symptoms.  Has had to come to emerge department before for migraine cocktail, states given her current symptoms she feels that she needed medications.  Reports some photophobia.  No neck stiffness, he has different than a typical migraine for her.  She notes that she feels dehydrated, and feels that she needs IV fluids.  Was given Zofran ODT by EMS, states this minimally helped her nausea, still feels nauseous.  Has not vomited since her to arrival.  No diarrhea.  No abdominal pain.  Describes her headache as a 10 out of 10 throbbing pain.    Review of Systems  Positives and pertinent negatives as per HPI. All other systems were reviewed and are negative     Past Medical History  Past Medical History:   Diagnosis Date   ? Hypothyroid    ? Migraine      Past Surgical History  Past Surgical History:   Procedure Laterality Date   ? BREAST AUGMENTATION     ? CESAREAN SECTION       Family History  History reviewed. No pertinent family history.    Social History  Social History   Substance Use Topics   ? Smoking status: Never Smoker   ? Smokeless tobacco: Never Used   ? Alcohol use Yes      Comment: socially     Medications  There are no discharge medications for  this patient.     Allergies  No Known Allergies    PHYSICAL EXAM  Initial Vital Signs  Visit Vitals   ? BP (!) 100/53 (BP Location: Left arm, Patient Position: Sitting)   ? Pulse 76   ? Resp 17   ? Ht 5' 2   ? Wt 54.4 kg (120 lb)   ? LMP 06/20/2015   ? SpO2 100%   ? BMI 21.95 kg/m2     Vital Signs During ED Visit  (as charted by nursing)  Patient Vitals for the past 24 hrs:   BP Temp src Pulse Resp SpO2 Height Weight   06/22/15 1906 (!) 105/59 - 87 16 99 % - -   06/22/15 1602 122/73 - 91 16 100 % - -   06/22/15 1355 (!) 100/53 Oral 76 17 100 % 5' 2 54.4 kg (120 lb)     Constitutional: appears uncomfortable, lying L lateral decubitus, holding emesis bag   Head: Atraumatic, normocephalic   Eyes:  pupils equal and reactive, extraocular eye movements intact   ENT: mucous membranes pink and dry  Neck: supple, full ROM   Respiratory: respirations easy and regular, no respiratory distress   Heart:: regular rate  and rhythm, no obvious murmur   Abdomen: soft, no focal tenderness, no rebound or guarding  Neuro: awake, alert, oriented x 3, strength and sensation grossly intact  Psych: appears uncomfortable , sl anxious, otherwise appropriate   Skin: warm, dry  MSK/Extremities: no edema, 2+ pulses throughout     ED COURSE    Laboratory Results (if any) During ED Visit  Results for orders placed or performed during the hospital encounter of 06/22/15   BMP   Result Value Ref Range    Sodium 144 135 - 145 mmol/L    Potassium 4.4 3.5 - 5.1 mmol/L    Chloride 108 98 - 108 mmol/L    Bicarbonate 20 (L) 21 - 32 mmol/L    Anion Gap 20 10 - 20 mmol/L    Glucose 78 65 - 99 mg/dL    BUN 14 8 - 25 mg/dL    Creatinine 1.61 0.96 - 1.10 mg/dL    eGFR 045 >=40 JW/JXB/1.47 m2    BUN/Creatinine Ratio 23.3 (H) 10.0 - 20.0    Calcium 8.3 (L) 8.4 - 10.2 mg/dL   Urinalysis   Result Value Ref Range    Color, Urine Yellow Colorless, Yellow    Clarity, Urine Clear Clear    Specific Gravity 1.017 1.005 - 1.025    pH, Urine 6.0 5.0 - 7.0    Protein,  Urine Negative Negative mg/dL    Glucose, Urine Negative Negative mg/dL    Ketones, Urine >=82 (A) Negative mg/dL    Bilirubin, Urine Negative Negative    Urobilinogen, Urine <2.0 <2.0 mg/dL    Blood, Urine Moderate (A) Negative    Nitrite, Urine Negative Negative    Leukocyte Esterase, Urine Negative Negative    WBCs, Urine <1 0 - 5 /hpf    RBCs, Urine 1 0 - 3 /hpf    Bacteria, Urine None Seen None Seen /hpf    Squamous Epithelial 1 0 - 4 /hpf   POC Urine Pregnancy   Result Value Ref Range    POC Preg Test, Ur Negative Negative    Internal Control Pass     Spec Grav, UA  1.005 - 1.025   CBC Auto Differential   Result Value Ref Range    WBC 6.08 4.50 - 11.00 K/mcL    RBC 5.41 (H) 4.00 - 5.20 M/mcL    Hemoglobin 16.1 (H) 12.0 - 16.0 g/dL    Hematocrit 95.6 (H) 36.0 - 46.0 %    MCV 86.7 80.0 - 100.0 fL    MCH 29.8 26.0 - 34.0 pg    MCHC 34.3 31.0 - 37.0 g/dL    Platelets 213 086 - 400 K/mcL    RDW - CV 12.2 11.6 - 14.8 %    MPV 11.2 9.0 - 15.5 fL    Neutrophils 84.4 %    Lymphocytes 12.7 %    Monocytes 2.3 %    Eosinophils 0.2 %    Basophils 0.2 %    IG Percent 0.20 %    Neutrophils Abs 5.14 1.70 - 7.00 K/mcL    Lymphocytes Abs 0.77 (L) 0.90 - 4.00 K/mcL    Monocytes Abs 0.14 (L) 0.30 - 0.90 K/mcL    Eosinophils Abs 0.01 0.00 - 0.50 K/mcL    Basophils Abs 0.01 0.00 - 0.30 K/mcL    IG Absolute 0.01 0.00 - 0.30 K/mcL    Nucleated RBC 0.0 %    Nucleated RBC Abs 0.00 0.00 - 0.00 K/mcL     Medications  Ordered/Given (if any) During ED Visit  Medications   sodium chloride 0.9% (NS) bolus 2,000 mL (0 mL Intravenous Stopped 06/22/15 1755)   metoclopramide (REGLAN) injection 10 mg (10 mg Intravenous Given 06/22/15 1555)   diphenhydrAMINE (BENADRYL) injection 50 mg (50 mg Intravenous Given 06/22/15 1556)   ketorolac (TORADOL) injection 30 mg (30 mg Intravenous Given 06/22/15 1555)   morphine 4 mg (4 mg Intravenous Given 06/22/15 1720)   ondansetron (ZOFRAN) injection 4 mg (4 mg Intravenous Given 06/22/15 1720)     Procedures (if any)  During ED Visit   Procedures    MEDICAL DECISION MAKING  Patient presents for evaluation of headache with vomiting, in the setting of alcohol consumption last night.  Has a history of migraines, states that he feels similar, but she also feels very dehydrated.  He appears uncomfortable, but is neurologically intact. VSS, exam otherwise WNL.  The lights were dimmed for comfort.  She was provided a blanket.  IV established.  Given 2 L of normal saline with a migraine cocktail--10 mg of Reglan, 50 mg of Benadryl, and 30 mg of Toradol.  She appears much more comfortable on reassessment, was actually sleeping in the room.  She still endorses some ongoing although more mild pain and nausea. I discussed with her like to give her additional medications, and morphine and Zofran ordered--discussed with this is not a typical migraine cocktail, but likely helpful given the dehydration and vomiting secondary to the alcohol use. Pregnancy test negative, UA with ketonuria.  CBC elevated H&H, I suspect secondary to hemoconcentration.  Likely stable on BNP, other than a low bicarb of 20, which I suspect is also due to vomiting.  After receiving additional morphine and Zofran with completion of IV fluids, the patient was able to tolerate p.o. without difficulty.  Notes marked improvement of symptoms, visibly appears that she feels much better.  Is planning to stay in [place] until this evening, and return home to [place] tomorrow, with plans to see her family physician early next week.  Encouraged return for any worsening symptoms.    IMPRESSION  Headache, hx migraines  N&V  Alcohol use     DISPOSITION/PLAN  Stable for discharge home  FU with PCP, referrals provided for PCP if needed  RTED for worsening symptoms     Alda Lea, DO, FACEP    This document was created with Dragon software      Daneen Schick Veitinger, DO  06/22/15 1910    Electronically signed by Adriana Mccallum, DO at 06/22/2015  7:10 PM EDT

## 2015-06-22 NOTE — ED Notes (Signed)
Formatting of this note might be different from the original.  Pt resting calmly in bed. Pt states is feeling better and is ready to go. Dr Franco Nones notified.   Electronically signed by Thersa Salt, RN at 06/22/2015  7:07 PM EDT

## 2015-06-22 NOTE — ED Notes (Signed)
Formatting of this note might be different from the original.  Lab states needs redraw for labs. phlebo to come redraw labs.  Electronically signed by Thersa Salt, RN at 06/22/2015  5:17 PM EDT

## 2015-06-22 NOTE — ED Notes (Signed)
Formatting of this note might be different from the original.  Pt discharged home. D/c instructions given. Pt denies concerns. Pt ambulating out of er without issue.  Electronically signed by Thersa Salt, RN at 06/22/2015  7:31 PM EDT

## 2015-06-22 NOTE — ED Notes (Signed)
Formatting of this note might be different from the original.  phlebo at bedside  Electronically signed by Thersa Salt, RN at 06/22/2015  5:52 PM EDT

## 2015-06-22 NOTE — ED Notes (Signed)
Formatting of this note might be different from the original.  Pt resting calmly in bed. Pt reports some improvement of pain but reports continued c/o headache. Pt aa&o x3. resp unlabored.  Electronically signed by Thersa Salt, RN at 06/22/2015  5:24 PM EDT

## 2016-02-28 DIAGNOSIS — Z803 Family history of malignant neoplasm of breast: Secondary | ICD-10-CM | POA: Insufficient documentation

## 2016-04-10 DIAGNOSIS — K420 Umbilical hernia with obstruction, without gangrene: Secondary | ICD-10-CM | POA: Insufficient documentation

## 2017-01-30 ENCOUNTER — Emergency Department: Payer: PRIVATE HEALTH INSURANCE | Primary: Family Medicine

## 2017-01-30 ENCOUNTER — Inpatient Hospital Stay
Admit: 2017-01-30 | Discharge: 2017-01-30 | Disposition: A | Discharge status: Home / Self Care | Payer: PRIVATE HEALTH INSURANCE

## 2017-01-30 ENCOUNTER — Emergency Department: Admit: 2017-01-30 | Payer: PRIVATE HEALTH INSURANCE | Primary: Family Medicine

## 2017-01-30 DIAGNOSIS — G43809 Other migraine, not intractable, without status migrainosus: Secondary | ICD-10-CM

## 2017-01-30 LAB — CBC WITH AUTO DIFFERENTIAL
Basophils %: 0.4 %
Basophils Absolute: 0 10*3/uL (ref 0.0–0.2)
Eosinophils %: 0.4 %
Eosinophils Absolute: 0 10*3/uL (ref 0.0–0.6)
Hematocrit: 49.8 % — ABNORMAL HIGH (ref 36.0–48.0)
Hemoglobin: 16.7 g/dL — ABNORMAL HIGH (ref 12.0–16.0)
Lymphocytes %: 14.5 %
Lymphocytes Absolute: 0.7 10*3/uL — ABNORMAL LOW (ref 1.0–5.1)
MCH: 30.8 pg (ref 26.0–34.0)
MCHC: 33.6 g/dL (ref 31.0–36.0)
MCV: 91.7 fL (ref 80.0–100.0)
MPV: 9 fL (ref 5.0–10.5)
Monocytes %: 5.1 %
Monocytes Absolute: 0.2 10*3/uL (ref 0.0–1.3)
Neutrophils %: 79.6 %
Neutrophils Absolute: 3.8 10*3/uL (ref 1.7–7.7)
Platelets: 182 10*3/uL (ref 135–450)
RBC: 5.43 M/uL — ABNORMAL HIGH (ref 4.00–5.20)
RDW: 13.5 % (ref 12.4–15.4)
WBC: 4.8 10*3/uL (ref 4.0–11.0)

## 2017-01-30 LAB — URINALYSIS
Bilirubin Urine: NEGATIVE
Blood, Urine: NEGATIVE
Glucose, Ur: NEGATIVE mg/dL
Ketones, Urine: NEGATIVE mg/dL
Leukocyte Esterase, Urine: NEGATIVE
Nitrite, Urine: NEGATIVE
Protein, UA: NEGATIVE mg/dL
Specific Gravity, UA: 1.015 (ref 1.005–1.030)
Urobilinogen, Urine: 0.2 E.U./dL (ref <2.0)
pH, UA: 8.5 — AB (ref 5.0–8.0)

## 2017-01-30 LAB — COMPREHENSIVE METABOLIC PANEL
ALT: 24 U/L (ref 10–40)
AST: 34 U/L (ref 15–37)
Albumin/Globulin Ratio: 1.4 (ref 1.1–2.2)
Albumin: 4.4 g/dL (ref 3.4–5.0)
Alkaline Phosphatase: 47 U/L (ref 40–129)
Anion Gap: 10 (ref 3–16)
BUN: 11 mg/dL (ref 7–20)
CO2: 21 mmol/L (ref 21–32)
Calcium: 9.4 mg/dL (ref 8.3–10.6)
Chloride: 109 mmol/L (ref 99–110)
Creatinine: 0.7 mg/dL (ref 0.6–1.1)
GFR African American: >60 (ref >60)
GFR Non-African American: >60 (ref >60)
Globulin: 3.2 g/dL
Glucose: 113 mg/dL — ABNORMAL HIGH (ref 70–99)
Potassium: 4.2 mmol/L (ref 3.5–5.1)
Sodium: 140 mmol/L (ref 136–145)
Total Bilirubin: 0.6 mg/dL (ref 0.0–1.0)
Total Protein: 7.6 g/dL (ref 6.4–8.2)

## 2017-01-30 LAB — EKG 12-LEAD
Atrial Rate: 78 {beats}/min
P Axis: 76 degrees
P-R Interval: 124 ms
Q-T Interval: 364 ms
QRS Duration: 78 ms
QTc Calculation (Bazett): 414 ms
R Axis: 69 degrees
T Axis: 42 degrees
Ventricular Rate: 78 {beats}/min

## 2017-01-30 LAB — LIPASE: Lipase: 38 U/L (ref 13.0–60.0)

## 2017-01-30 LAB — PREGNANCY, URINE: HCG(Urine) Pregnancy Test: NEGATIVE

## 2017-01-30 LAB — MAGNESIUM: Magnesium: 1.7 mg/dL — ABNORMAL LOW (ref 1.80–2.40)

## 2017-01-30 LAB — TROPONIN: Troponin: <0.01 ng/mL (ref <0.01)

## 2017-01-30 MED ORDER — DIPHENHYDRAMINE HCL 50 MG/ML IJ SOLN
50 MG/ML | Freq: Once | INTRAMUSCULAR | Status: AC
Start: 2017-01-30 — End: 2017-01-30
  Administered 2017-01-30: 16:00:00 50 mg via INTRAVENOUS

## 2017-01-30 MED ORDER — SODIUM CHLORIDE 0.9 % IV BOLUS
0.9 % | Freq: Once | INTRAVENOUS | Status: AC
Start: 2017-01-30 — End: 2017-01-30
  Administered 2017-01-30: 16:00:00 1000 mL via INTRAVENOUS

## 2017-01-30 MED ORDER — KETOROLAC TROMETHAMINE 30 MG/ML IJ SOLN
30 MG/ML | Freq: Once | INTRAMUSCULAR | Status: AC
Start: 2017-01-30 — End: 2017-01-30
  Administered 2017-01-30: 16:00:00 30 mg via INTRAVENOUS

## 2017-01-30 MED ORDER — SUMATRIPTAN SUCCINATE 6 MG/0.5ML SC SOLN
6 MG/0.5ML | Freq: Once | SUBCUTANEOUS | Status: AC
Start: 2017-01-30 — End: 2017-01-30
  Administered 2017-01-30: 17:00:00 6 mg via SUBCUTANEOUS

## 2017-01-30 MED ORDER — PROMETHAZINE HCL 25 MG PO TABS
25 MG | ORAL_TABLET | Freq: Four times a day (QID) | ORAL | 0 refills | Status: AC | PRN
Start: 2017-01-30 — End: ?

## 2017-01-30 MED ORDER — PHENAZOPYRIDINE HCL 100 MG PO TABS
100 MG | ORAL_TABLET | Freq: Three times a day (TID) | ORAL | 0 refills | Status: AC | PRN
Start: 2017-01-30 — End: 2017-02-02

## 2017-01-30 MED ORDER — METOCLOPRAMIDE HCL 5 MG/ML IJ SOLN
5 MG/ML | Freq: Once | INTRAMUSCULAR | Status: AC
Start: 2017-01-30 — End: 2017-01-30
  Administered 2017-01-30: 16:00:00 10 mg via INTRAVENOUS

## 2017-01-30 MED ORDER — SODIUM CHLORIDE 0.9 % IV BOLUS
0.9 % | Freq: Once | INTRAVENOUS | Status: AC
Start: 2017-01-30 — End: 2017-01-30
  Administered 2017-01-30: 19:00:00 500 mL via INTRAVENOUS

## 2017-01-30 MED ORDER — PROMETHAZINE HCL 25 MG/ML IJ SOLN
25 MG/ML | Freq: Once | INTRAMUSCULAR | Status: AC
Start: 2017-01-30 — End: 2017-01-30
  Administered 2017-01-30: 17:00:00 12.5 mg via INTRAMUSCULAR

## 2017-01-30 MED ORDER — MAGNESIUM SULFATE 2000 MG/50 ML IVPB PREMIX
2 GM/50ML | Freq: Once | INTRAVENOUS | Status: DC
Start: 2017-01-30 — End: 2017-01-30

## 2017-01-30 MED FILL — DIPHENHYDRAMINE HCL 50 MG/ML IJ SOLN: 50 mg/mL | INTRAMUSCULAR | Qty: 1

## 2017-01-30 MED FILL — SODIUM CHLORIDE 0.9 % IV SOLN: 0.9 % | INTRAVENOUS | Qty: 1000

## 2017-01-30 MED FILL — KETOROLAC TROMETHAMINE 30 MG/ML IJ SOLN: 30 mg/mL | INTRAMUSCULAR | Qty: 1

## 2017-01-30 MED FILL — SUMATRIPTAN SUCCINATE 6 MG/0.5ML SC SOLN: 6 MG/0.5ML | SUBCUTANEOUS | Qty: 0.5

## 2017-01-30 MED FILL — SODIUM CHLORIDE 0.9 % IV SOLN: 0.9 % | INTRAVENOUS | Qty: 500

## 2017-01-30 MED FILL — METOCLOPRAMIDE HCL 5 MG/ML IJ SOLN: 5 mg/mL | INTRAMUSCULAR | Qty: 2

## 2017-01-30 MED FILL — PROMETHAZINE HCL 25 MG/ML IJ SOLN: 25 mg/mL | INTRAMUSCULAR | Qty: 1

## 2017-01-30 NOTE — ED Notes (Signed)
Bed: 11  Expected date:   Expected time:   Means of arrival:   Comments:  ut     Janee MornJohn Kayleeann Huxford, RN  01/30/17 1010

## 2017-01-30 NOTE — ED Notes (Signed)
Discharge instructions reviewed with Pt. Pt verbalizes understanding at this time. Prescriptions/medications reviewed with pt at this time. VS WNL. Pt condition stable at this time. No concerns voiced.      Harrie ForemanBrandon James Electa Sterry, RN  01/30/17 [id number]

## 2017-01-30 NOTE — ED Notes (Signed)
Pt refuses flu swab at this time.pt request for dilaudid and phenergan for h/a and nausea.andrew p.a. Aware.awaiting further orders,     Sandra CockayneRobert Lee Daviel Allegretto, RN  01/30/17 1054

## 2017-01-30 NOTE — ED Provider Notes (Signed)
[facility] Emergency Department    CHIEF COMPLAINT  Migraine (Squad arrived at house and patient was actively vomiting in toilet. Patient takes imitrex for migraines) and Irregular Heart Beat (patient states she felt like her heart was "thumping")      HISTORY OF PRESENT ILLNESS  Lacey Shaw is a 42 y.o. female who presents to the ED complaining of several history of headache, palpitations with nausea and vomiting.  Patient is observed lying in bed, appears nontoxic and in no acute distress at this time.  Patient is brought in by squad today for evaluation.  Patient with history of migraine headaches.  States that today's headache typical of that.  Reports that she has had nausea and vomiting.  Reports that she has also had some palpitations which is not normal for her.  No associated chest pain.  Reports rapid breathing and does report some feelings of anxiousness.  Denies any back pain.  No neck, arm, or jaw pain.  No visual changes or disturbances.  No difficulty speaking or swallowing.  No numbness, tingling, weakness of extremity.  Mild for abdominal discomfort.  Last ate last evening.  Denies any constipation.  Did have some mild diarrhea.  No black or bloody stools.  Denies urinary symptoms.  Last normal menstrual cycle approximately one month ago.  No vaginal bleeding, pain or discharge.  No rashes.  No recent travel.  No sick contacts.  Does report feeling chilled.  Denies fevers.    No other complaints, modifying factors or associated symptoms.     Nursing notes reviewed.   Past Medical History:   Diagnosis Date   . Brain tumor (HCC)     non-cancerous   . Migraine      Past Surgical History:   Procedure Laterality Date   . CESAREAN SECTION       History reviewed. No pertinent family history.  Social History     Social History   . Marital status: Single     Spouse name: N/A   . Number of children: N/A   . Years of education: N/A     Occupational History   . Not on file.      Social History Main Topics   . Smoking status: Never Smoker   . Smokeless tobacco: Never Used   . Alcohol use No   . Drug use: No   . Sexual activity: Yes     Partners: Male     Other Topics Concern   . Not on file     Social History Narrative   . No narrative on file     Current Facility-Administered Medications   Medication Dose Route Frequency Provider Last Rate Last Dose   . diphenhydrAMINE (BENADRYL) injection 50 mg  50 mg Intravenous Once Christain [place], [place]       . metoclopramide (REGLAN) injection 10 mg  10 mg Intravenous Once Christain [place], [place]       . ketorolac (TORADOL) injection 30 mg  30 mg Intravenous Once Christain [place], [place]       . 0.9 % sodium chloride bolus  1,000 mL Intravenous Once Christain [place], [place]         Current Outpatient Prescriptions   Medication Sig Dispense Refill   . SUMAtriptan (IMITREX) 100 MG tablet Take 100 mg by mouth once as needed for Migraine     . metoclopramide (REGLAN) 10 MG tablet Take 1 tablet by mouth 4 times daily for 10 days. 15 tablet  0   . levothyroxine (SYNTHROID) 25 MCG tablet Take 25 mcg by mouth Daily.     . indomethacin (INDOCIN) 50 MG capsule Take 1 capsule by mouth 3 times daily (with meals) for 21 doses. 21 capsule 0   . traZODone (DESYREL) 50 MG tablet Take 1 tablet by mouth nightly for 5 days. 5 tablet 0     Allergies   Allergen Reactions   . Latex        REVIEW OF SYSTEMS  10 systems reviewed, pertinent positives per HPI otherwise noted to be negative    PHYSICAL EXAM  BP 109/64   Pulse 79   Temp 97.5 F (36.4 C) (Oral)   Resp 14   Ht 5\' 2"  (1.575 m)   Wt 120 lb (54.4 kg)   LMP 01/08/2017   SpO2 100%   Breastfeeding? Unknown   BMI 21.95 kg/m   GENERAL APPEARANCE: Awake and alert. Cooperative. No acute distress.  HEAD: Normocephalic. Atraumatic.  EYES: PERRL. EOM's grossly intact.   ENT: Mucous membranes are moist.   NECK: Supple.  No JVD.  No tracheal tenderness or deviation.  No crepitus.  HEART: RRR. No murmurs.  No chest wall tenderness.  LUNGS:  Respirations unlabored. CTAB. Good air exchange. Speaking comfortably in full sentences.  No paradoxical motion.  No subcutaneous emphysema.  ABDOMEN: Soft. Non-distended.  Mild epigastric tenderness.  No rigidity, guarding or rebound.  Negative Murphy's, McBurney's, Rovsing's.  No fluids or ascites.  No hernias or masses.  Bowel sounds normal quadrants.  No CVA tenderness.  EXTREMITIES: No peripheral edema. Moves all extremities equally. All extremities neurovascularly intact.   SKIN: Warm and dry. No acute rashes.   NEUROLOGICAL: Alert and oriented. CN's 2-12 intact. No gross facial drooping. Strength 5/5, sensation intact.   PSYCHIATRIC: Normal mood and affect.    RADIOLOGY  Xr Chest Standard (2 Vw)    Result Date: 01/30/2017  EXAMINATION: TWO VIEWS OF THE CHEST 01/30/2017 10:29 am COMPARISON: None. HISTORY: ORDERING SYSTEM PROVIDED HISTORY: sob TECHNOLOGIST PROVIDED HISTORY: Reason for exam:->sob Ordering Physician Provided Reason for Exam: SOB Acuity: Acute Type of Exam: Initial FINDINGS: Lungs are clear.  No cardiomegaly.  No pulmonary edema.     Negative chest radiographs.     ED COURSE   I have evaluated this patient.     Patient received Benadryl, Reglan, and Toradol for headache and nausea, with minimal relief.  Triage vitals stable.  Patient was given 1 L IV fluid bolus.  Urinalysis unremarkable and urine pregnancy negative.  Mild hemoconcentration with elevated H&H.  Patient was given 1 L IV fluid bolus.  CMP unremarkable.  Lipase normal.  Magnesium 1.7.  Patient offered magnesium sulfate and fusion however declined.  States that she is on magnesium supplements at home.  Troponin less than 0.01.  Chest x-ray clear.  EKG was normal sinus rhythm without evidence for acute ischemic change.  On reevaluation patient complaining of ongoing headache and nausea.  Given dose of subcutaneous emesis tracks and IM Phenergan with significant relief.  After discussion she does feel comfortable going home with continued  PCP follow-up.  Also provided with cardiology referral for potential Holter monitor placement given palpitations.  I have discussed return precautions otherwise in patient is in agreement and comfortable discharge.  A discussion was had with Mrs. Zettler regarding headaches and nausea, palpitations, ED findings and recommendations for follow-up.  All questions were answered. Patient will follow up with  PCP and cardiology within one week for further  evaluation/treatment. Patient will return to ED for new/worsening symptoms.    Patient was sent home with a prescription for Phenergan.    MDM  I estimate there is LOW risk for ACUTE CORONARY SYNDROME, INTRACRANIAL HEMORRHAGE, MALIGNANT DYSRHYTHMIA, MENINGITIS, PNEUMONIA, PULMONARY EMBOLISM, SEPSIS, SUBARACHNOID HEMORRHAGE, SUBDURAL HEMATOMA, STROKE, or URINARY TRACT INFECTION, thus I consider the discharge disposition reasonable. Ian Malkin and I have discussed the diagnosis and risks, and we agree with discharging home to follow-up with their primary doctor. We also discussed returning to the Emergency Department immediately if new or worsening symptoms occur. We have discussed the symptoms which are most concerning (e.g., changing or worsening pain, weakness, vomiting, fever) that necessitate immediate return.    Final Impression  1. Other migraine without status migrainosus, not intractable    2. Palpitations      Blood pressure (!) 103/58, pulse 65, temperature 97.5 F (36.4 C), temperature source Oral, resp. rate 20, height 5\' 2"  (1.575 m), weight 120 lb (54.4 kg), last menstrual period 01/08/2017, SpO2 98 %, unknown if currently breastfeeding.    DISPOSITION  Patient was discharged to home in good condition.          Christain [place], [place]  01/30/17 1330

## 2018-12-14 NOTE — Anesthesia Pre-Procedure Evaluation (Signed)
Formatting of this note is different from the original.  ANESTHESIA EVALUATION    I have reviewed the patient summary  and existing labs  .   (+) History of anesthetic complications (severe PONV): PONV .  (-) No family history of anesthetic complications .    Review of Systems and Medical History:   CNS/Musculoskeletal:    (+)   Headaches: migraines.  Psychiatric history: Anxiety . GI/Hepatic/Renal: negative ROS     Cardiovascular: negative ROS  (+) Exercise tolerance: 7-9 Mets Endocrine/Other:   (+) thyroid problem Hypothyroidism.    Respiratory:  neg pulmonary ROS Substance Use/Abuse:  negative ROS    Obstetrical Hx:     Obstetrics:         Non-Obstetrical Hx:          Airway:  Mallampati: I  TM distance: >3 FB  Neck ROM: full        Dental: - no notable dental history  .      Anesthesia Plan:    ASA: 2    Plan: General      Induction: Intravenous    NPO status verified  Patient is appropriate for LMA  Consent:  - Anesthesia discussed major risks, alternatives, benefits, postop recovery process and destination.  Anesthesia plan understood and accepted.  All questions answered. The following person(s) agreed to procedure: patient           Electronically signed by Georgina Quint., MD at 12/23/2018  1:10 PM EST

## 2018-12-23 NOTE — Anesthesia Post-Procedure Evaluation (Signed)
Formatting of this note is different from the original.  Anesthesia Post-op Note    Patient Name:  Brandy Roy      Procedure(s):  BILATERAL SALINE BREAST IMPLANT EXCHANGE TO SALINE IMPLANTS    Patient location: Phase II    I have personally evaluated the recovery of this patient from anesthesia:  Yes    Vital Signs:    Vitals:    12/23/18 1530   BP: 95/79     Vitals:    12/23/18 1530   Pulse: 62     Vitals:    12/23/18 1530   Temp: 37.1 C     Vitals:    12/23/18 1530   Resp: 19     Vitals:    12/23/18 1530   SpO2: 100%     Airway Patency: Excellent    Mental Status:  Alert and oriented    Pain Status:  Controlled    Nausea: None    Vomiting:  No    Hydration Status:  adequate      Perioperative Outcomes:  None     Additional Comments:      Electronically signed by Georgina Quint., MD at 12/23/2018  3:37 PM EST

## 2018-12-23 NOTE — Unmapped (Signed)
Formatting of this note is different from the original.  Anesthesia OR Handoff Note    Patient Name:  Brandy Roy      Procedure(s):  BILATERAL SALINE BREAST IMPLANT EXCHANGE TO SALINE IMPLANTS    Pre-op diagnosis: Encounter for cosmetic procedure [Z41.1]    Surgeon/Service: Aleatha Borer., MD, Plastics    Patient location: PACU    Anesthesia type: General    Medical history:   Past Medical History:   Diagnosis Date   ? Hx of migraines      Intra-procedure management issues/concerns: Uneventful      Post-op pain: Adequate analgesia    Post-op assessment: no apparent anesthetic complications, tolerated procedure well and no evidence of recall    Vitals:   Vitals:    12/23/18 1136   BP: 108/60   Pulse: 59   Resp: 20   Temp:    SpO2: 100%     Heart rate, blood pressure, respiratory rate, and oxygen saturation have been recorded and reviewed on arrival to PACU. Refer to nursing documentation.    Vitals status: stable    Post-op Airway: Room Air    Level of consciousness: awake    Complications: none    Plans for early post-procedure period: Progress to discharge from PACU    After opportunity for questions, ICU/PACU team accepts care of patient.     Receiving RN name: Marcelino Duster    Additional Comments:     Electronically signed by Georgina Quint., MD at 12/23/2018  2:34 PM EST

## 2019-12-21 ENCOUNTER — Ambulatory Visit: Payer: Self-pay

## 2019-12-21 ENCOUNTER — Ambulatory Visit: Payer: Self-pay | Admitting: Family Medicine

## 2019-12-21 ENCOUNTER — Other Ambulatory Visit: Payer: Self-pay

## 2019-12-21 DIAGNOSIS — M25552 Pain in left hip: Secondary | ICD-10-CM | POA: Diagnosis not present

## 2019-12-21 NOTE — Progress Notes (Signed)
ultr

## 2019-12-21 NOTE — Progress Notes (Signed)
Office Visit Note   Patient: Brittany Rogers           Date of Birth: May 02, 1975           MRN: [id number] Visit Date: 12/21/2019 Requested by: No referring provider defined for this encounter. PCP: No primary care provider on file.  Subjective: Chief Complaint  Patient presents with  . Left Hip - Pain    Pain in the left buttock and around to lateral aspect of hip x 1 month. Tingling in the leg. Pain is constant.  NKI. The pain did start,however, after having pellet hormone therapy 2 weeks before (for headaches). Has had dry needling twice with Karin Golden at [facility].    HPI: She is here at the request of Lorenda Peck for left hip and leg pain.  She states that in July she fell down some stairs hitting her lower back against the stairs.  She thought she had just bruised herself, and it took a couple months but eventually her pain improved.  Then about a month ago she went to her doctor in [place] for her 29-month testosterone pellet injection.  About a week later, she started having pain in the posterior hip with tingling down the leg.  The pain is fairly constant, but worse when bending or twisting.  It is also worse when trying to sleep.  She started seeing Karin Golden for physical therapy and it seems to be helping, especially after the last treatment.  But she was referred here to be sure the testosterone pellet is not causing any nerve compression.                ROS:   All other systems were reviewed and are negative.  Objective: Vital Signs: There were no vitals taken for this visit.  Physical Exam:  General:  Alert and oriented, in no acute distress. Pulm:  Breathing unlabored. Psy:  Normal mood, congruent affect. Skin: No rash Left hip: She is tender to palpation in the left lumbar paraspinous muscles and spinous processes at L4-5.  Palpation in this area makes the tingling in her leg worse.  She is also tender in the left sciatic notch.  Her testosterone pellets are palpable in  the subcutaneous tissue several centimeters lateral to the sciatic notch.  She has slight tenderness in there, but the sciatic notch reproduces her pain more.  She has increased tingling down the leg with stork test on the left, also on the right.  Straight leg raise is positive on the left, negative on the right.  Lower extremity strength and reflexes remain normal.  Piriformis stretch is negative.  She has bilateral tight hip flexors.    Imaging: US Guided Needle Placement - No Linked Charges  Result Date: 12/21/2019 Limited diagnostic ultrasound of her left hip reveals a testosterone pellet in the fatty layer, not involving the muscle layer.  It is well away from the sciatic nerve which was also visualized.  No muscular abnormality seen.  XR Lumbar Spine Complete  Result Date: 12/21/2019 X-rays lumbar spine reveal moderate L5-S1 degenerative disc disease with 7 mm of anterolisthesis of L5 on S1 and bilateral pars defects.  No sign of compression fracture.  There are surgical clips in the pelvic area.   Assessment & Plan: 1.  Left posterior hip pain with left leg sciatica, most likely related to L5-S1 spondylolisthesis with disc protrusion. -Elected to try McKenzie protocol in physical therapy.  Ultimately she will need to work on flexibility of  hip flexors.  She needs to be cautious with weight lifting. -MRI if symptoms persist.     Procedures: No procedures performed        PMFS History: There are no problems to display for this patient.  No past medical history on file.  No family history on file.   Social History   Occupational History  . Not on file  Tobacco Use  . Smoking status: Not on file  . Smokeless tobacco: Not on file  Substance and Sexual Activity  . Alcohol use: Not on file  . Drug use: Not on file  . Sexual activity: Not on file

## 2020-03-01 ENCOUNTER — Telehealth: Payer: Self-pay | Admitting: *Deleted

## 2020-03-01 NOTE — Telephone Encounter (Signed)
NOTES ON FILE FROM [facility], [place], [place] [phone]. REFERRAL SENT TO SCHEDULING.

## 2020-03-05 ENCOUNTER — Other Ambulatory Visit: Payer: Self-pay | Admitting: Family Medicine

## 2020-03-05 ENCOUNTER — Other Ambulatory Visit: Payer: Self-pay | Admitting: *Deleted

## 2020-03-05 ENCOUNTER — Encounter: Payer: Self-pay | Admitting: *Deleted

## 2020-03-05 ENCOUNTER — Ambulatory Visit (INDEPENDENT_AMBULATORY_CARE_PROVIDER_SITE_OTHER): Payer: 59

## 2020-03-05 DIAGNOSIS — I499 Cardiac arrhythmia, unspecified: Secondary | ICD-10-CM

## 2020-03-05 DIAGNOSIS — R002 Palpitations: Secondary | ICD-10-CM

## 2020-03-05 NOTE — Progress Notes (Signed)
Patient ID: Brittany Rogers, female   DOB: 18-Feb-1975, 45 y.o.   MRN: [id number] Patient enrolled for [employer] to ship a 14 day ZIO XT long term holter monitor to her home. Letter with instructions mailed to patient.

## 2020-04-15 ENCOUNTER — Telehealth: Payer: Self-pay | Admitting: Interventional Cardiology

## 2020-04-15 NOTE — Telephone Encounter (Signed)
Becky from [facility] called and stated that they need a final Holter Monitor reading sent to them. Has the preliminary reading already but not final. Fax to [phone]; Attn to [facility]

## 2020-04-18 ENCOUNTER — Ambulatory Visit: Payer: 59 | Admitting: Interventional Cardiology

## 2020-04-22 NOTE — Telephone Encounter (Signed)
Faxed

## 2020-08-28 ENCOUNTER — Other Ambulatory Visit: Payer: Self-pay

## 2020-08-28 ENCOUNTER — Ambulatory Visit: Payer: Self-pay

## 2020-08-28 ENCOUNTER — Ambulatory Visit (INDEPENDENT_AMBULATORY_CARE_PROVIDER_SITE_OTHER): Payer: 59 | Admitting: Family Medicine

## 2020-08-28 ENCOUNTER — Encounter: Payer: Self-pay | Admitting: Family Medicine

## 2020-08-28 DIAGNOSIS — M25512 Pain in left shoulder: Secondary | ICD-10-CM | POA: Diagnosis not present

## 2020-08-28 DIAGNOSIS — M25552 Pain in left hip: Secondary | ICD-10-CM

## 2020-08-28 MED ORDER — PREDNISONE 10 MG PO TABS: ORAL_TABLET | ORAL | 0 refills | Status: DC

## 2020-08-28 NOTE — Progress Notes (Signed)
Office Visit Note   Patient: Brittany Rogers           Date of Birth: 04-14-1975           MRN: [id number] Visit Date: 08/28/2020 Requested by: No referring provider defined for this encounter. PCP: No primary care provider on file.  Subjective: Chief Complaint  Patient presents with   Left Shoulder - Pain    Unsure of any specific injury, but does do Crossfit. She thinks the rope climbs might have caused the problem, or just all the repetitive exercises accumulative. Has had the pain x 2 weeks. Massage, ibuprofen stretching exercises for ROM  and warm compress (feels better than ice). Achiness in the shoulder.    HPI: She is here with left shoulder pain.  Symptoms started a couple weeks ago, no injury.  Pain mostly on the anterior aspect of the shoulder.  She stopped doing overhead press exercises but the pain has persisted.  She has been doing massage and taking ibuprofen and applying warm compresses.  She is right-hand dominant.  Denies any popping or clicking in the shoulder, denies any numbness or tingling.                ROS:   All other systems were reviewed and are negative.  Objective: Vital Signs: There were no vitals taken for this visit.  Physical Exam:  General:  Alert and oriented, in no acute distress. Pulm:  Breathing unlabored. Psy:  Normal mood, congruent affect.  Left shoulder: Full active range of motion, no adhesive capsulitis.  No tenderness at the [facility] joint, negative AC crossover test.  She has moderate tenderness over the long head biceps tendon.  No detectable subluxation of it, speeds test is negative.  Empty can test is negative, she has no pain with internal/external rotation against resistance.  Overall rotator cuff strength is 5/5.    Imaging: US Guided Needle Placement  Result Date: 08/28/2020 Diagnostic ultrasound of the left shoulder reveals long head biceps tendon located in its groove.  There is some fluid in the tendon sheath but the tendon itself  looks normal.  Pectoralis tendon looks normal.  Subscapularis and supraspinatus look normal except for a hyperechoic calcification deep within the anterior fibers of the supraspinatus.  Infraspinatus looks normal, posterior labrum looks normal.  XR Shoulder Left  Result Date: 08/28/2020 X-rays of the left shoulder reveal moderate AC joint arthropathy.  Glenohumeral joint looks normal.  No soft tissue calcifications seen.  Alignment is anatomic.   Assessment & Plan:  Left shoulder long head biceps tendinopathy -Elected to treat with a prednisone taper.  Injection or MRI scan if symptoms persist.     Procedures: No procedures performed        PMFS History: There are no problems to display for this patient.  Past Medical History:  Diagnosis Date   Abdominal pain    Acute otitis media    Adrenal insufficiency (Addison's disease) (HCC)    Candida vaginitis    Cellulitis of nasal tip    Conjunctivitis    Cough    Depression    Diastasis recti    GAD (generalized anxiety disorder)    Hypothyroid    Irregular heart rate    Migraine    Murmur    Nausea    Nipple dermatitis    Otitis externa    Panic attack    Pharyngitis    Pneumonia due to COVID-19 virus    Sciatica  Sinusitis    Spondylisthesis    Umbilical hernia    UTI (urinary tract infection)    Vomiting     History reviewed. No pertinent family history.  History reviewed. No pertinent surgical history. Social History   Occupational History   Not on file  Tobacco Use   Smoking status: Not on file   Smokeless tobacco: Not on file  Substance and Sexual Activity   Alcohol use: Not on file   Drug use: Not on file   Sexual activity: Not on file

## 2020-09-10 ENCOUNTER — Other Ambulatory Visit: Payer: Self-pay

## 2020-09-10 ENCOUNTER — Telehealth: Payer: Self-pay

## 2020-09-10 DIAGNOSIS — M25512 Pain in left shoulder: Secondary | ICD-10-CM

## 2020-09-10 MED ORDER — MELOXICAM 15 MG PO TABS: 15.0000 mg | ORAL_TABLET | Freq: Every day | ORAL | 0 refills | Status: DC

## 2020-09-10 NOTE — Telephone Encounter (Signed)
Patient called. She would like a MRI. Her call back number is [phone]. Also she would like some pain medication.

## 2020-09-10 NOTE — Telephone Encounter (Signed)
Ok for mobic 15 po q d # 30 and mri arthrogram affected shoulder thx

## 2020-09-10 NOTE — Telephone Encounter (Signed)
I called the patient and advised her of the plan. Rx called in to [facility] at [address]. She is very claustrophobic, so requesting an open MRI -- order sent to [facility].

## 2020-09-10 NOTE — Telephone Encounter (Signed)
Asking for guidance on this patient of Dr. Prince Rome:  her shoulder felt better while she was on prednisone, but returned shortly after. She would like to proceed with an MRI,  and would like something she can take for inflammation. The patient is not interested in any injections or medication that would make her groggy. Ok to order the MRI and then follow up with you? What should she take for the inflammation?

## 2020-09-20 ENCOUNTER — Telehealth: Payer: Self-pay

## 2020-09-20 NOTE — Telephone Encounter (Signed)
The patient is calling today about the MR Arthrogram that was ordered by Dr. August Saucer recently (see most recent telephone message). She has an extreme fear of needles and does not feel like she can do the contrast ---- she is asking that the order be changed to MRI without the contrast. Is this ok?

## 2020-09-20 NOTE — Telephone Encounter (Signed)
Okay for regular MRI if she really wants to go that route but she needs to be aware that MRI without contrast is not as sensitive when it comes to picking up subtle evidence of shoulder pathology.  Personally I would recommend sticking with the MRI arthrogram.

## 2020-09-23 ENCOUNTER — Telehealth: Payer: Self-pay

## 2020-09-23 NOTE — Telephone Encounter (Signed)
Location has been changed as requested

## 2020-09-23 NOTE — Telephone Encounter (Signed)
The patient had called on Friday (9/09) about not wanting the arthrogram, but would like just the shoulder MRI instead (she does not want any needle-guided procedures). Please see that message with Luke's response. The patient would like the MRI to be as open as possible. The order will need to be changed in the chart.

## 2020-10-15 ENCOUNTER — Other Ambulatory Visit: Payer: Self-pay | Admitting: Orthopedic Surgery

## 2020-10-15 DIAGNOSIS — M25512 Pain in left shoulder: Secondary | ICD-10-CM

## 2020-10-15 NOTE — Telephone Encounter (Signed)
Patient called needing Rx refilled Meloxicam. The number to contact patient is [phone]

## 2020-10-17 ENCOUNTER — Other Ambulatory Visit: Payer: Self-pay | Admitting: Orthopedic Surgery

## 2020-10-17 ENCOUNTER — Other Ambulatory Visit: Payer: 59

## 2020-10-17 ENCOUNTER — Ambulatory Visit
Admission: RE | Admit: 2020-10-17 | Discharge: 2020-10-17 | Disposition: A | Discharge status: Home / Self Care | Payer: 59 | Source: Ambulatory Visit | Attending: Orthopedic Surgery | Admitting: Orthopedic Surgery

## 2020-10-17 DIAGNOSIS — M25512 Pain in left shoulder: Secondary | ICD-10-CM

## 2020-10-20 MED ORDER — MELOXICAM 15 MG PO TABS: 15.0000 mg | ORAL_TABLET | Freq: Every day | ORAL | 0 refills | Status: DC

## 2020-10-23 ENCOUNTER — Ambulatory Visit (INDEPENDENT_AMBULATORY_CARE_PROVIDER_SITE_OTHER): Payer: 59 | Admitting: Orthopedic Surgery

## 2020-10-23 ENCOUNTER — Encounter: Payer: Self-pay | Admitting: Orthopedic Surgery

## 2020-10-23 ENCOUNTER — Other Ambulatory Visit: Payer: Self-pay

## 2020-10-23 DIAGNOSIS — M25512 Pain in left shoulder: Secondary | ICD-10-CM | POA: Diagnosis not present

## 2020-10-23 MED ORDER — MELOXICAM 15 MG PO TABS: 15.0000 mg | ORAL_TABLET | Freq: Every day | ORAL | 0 refills | Status: DC

## 2020-10-23 NOTE — Progress Notes (Signed)
Office Visit Note   Patient: Brittany Rogers           Date of Birth: 04-05-1975           MRN: [id number] Visit Date: 10/23/2020 Requested by: No referring provider defined for this encounter. PCP: Pcp, No  Subjective: Chief Complaint  Patient presents with   Other    Scan review    HPI: Brittany Rogers is an extremely fit 45 year old person with left shoulder pain.  Reports primarily anterior pain in the shoulder.  She has done gymnastics most of her life.  She has been doing massage physical therapy and exercises.  This has been going on for the last 6 weeks.  Mobic does help her.  She is going to [place] tomorrow.  Denies much in the way of mechanical symptoms.  MRI scan is reviewed with the patient and it shows some edema in the [facility] joint as well as partial-thickness articular sided rotator cuff tear which looks to be less than 50% of the footprint surface area.              ROS: All systems reviewed are negative as they relate to the chief complaint within the history of present illness.  Patient denies  fevers or chills.   Assessment & Plan: Visit Diagnoses:  1. Acute pain of left shoulder     Plan: Impression is left shoulder pain with AC joint arthritis and partial-thickness rotator cuff tear in a very active patient who enjoys doing CrossFit.  Plan is observation for now.  Cautioned her against doing too many high stress activities on the shoulder such as clean and jerks, handstands, and walking around on her hands.  I do not see a definite operative indication in the shoulder at this time.  She is adamant that she does not want surgery as well.  I think strengthening the anterior and posterior force couple muscles of the shoulder can help unload some of the supraspinatus.  She needs to be careful lifting out in front of her body.  Discussed signs and symptoms of acute tearing of the rotator cuff.  I think if she is reasonably moderate in her exercises this could heal and become less  symptomatic.  The [facility] joint is not particularly tender today but does have some arthritic change present.  I do not think that is really a pain generator in the shoulder at this time.  She will follow-up as needed.  Follow-Up Instructions: Return if symptoms worsen or fail to improve.   Orders:  No orders of the defined types were placed in this encounter.  Meds ordered this encounter  Medications   meloxicam (MOBIC) 15 MG tablet    Sig: Take 1 tablet (15 mg total) by mouth daily.    Dispense:  45 tablet    Refill:  0      Procedures: No procedures performed   Clinical Data: No additional findings.  Objective: Vital Signs: There were no vitals taken for this visit.  Physical Exam:   Constitutional: Patient appears well-developed HEENT:  Head: Normocephalic Eyes:EOM are normal Neck: Normal range of motion Cardiovascular: Normal rate Pulmonary/chest: Effort normal Neurologic: Patient is alert Skin: Skin is warm Psychiatric: Patient has normal mood and affect   Ortho Exam: Ortho exam demonstrates full active and passive range of motion of both shoulders.  Excellent rotator cuff strength infraspinatus supraspinatus and subscap muscle testing.  No masses lymphadenopathy or skin changes noted in the shoulder girdle region.  Rotator  cuff strength is excellent.  No asymmetry of external rotation.  No discrete AC joint tenderness left versus right.  O'Brien's testing is negative.  No coarse grinding or crepitus with internal and external rotation of the left shoulder.  Specialty Comments:  No specialty comments available.  Imaging: No results found.   PMFS History: There are no problems to display for this patient.  Past Medical History:  Diagnosis Date   Abdominal pain    Acute otitis media    Adrenal insufficiency (Addison's disease) (HCC)    Candida vaginitis    Cellulitis of nasal tip    Conjunctivitis    Cough    Depression    Diastasis recti    GAD  (generalized anxiety disorder)    Hypothyroid    Irregular heart rate    Migraine    Murmur    Nausea    Nipple dermatitis    Otitis externa    Panic attack    Pharyngitis    Pneumonia due to COVID-19 virus    Sciatica    Sinusitis    Spondylisthesis    Umbilical hernia    UTI (urinary tract infection)    Vomiting     History reviewed. No pertinent family history.  History reviewed. No pertinent surgical history. Social History   Occupational History   Not on file  Tobacco Use   Smoking status: Not on file   Smokeless tobacco: Not on file  Substance and Sexual Activity   Alcohol use: Not on file   Drug use: Not on file   Sexual activity: Not on file

## 2021-01-07 ENCOUNTER — Other Ambulatory Visit: Payer: Self-pay | Admitting: Orthopedic Surgery

## 2021-02-03 ENCOUNTER — Ambulatory Visit (INDEPENDENT_AMBULATORY_CARE_PROVIDER_SITE_OTHER): Payer: 59

## 2021-02-03 ENCOUNTER — Ambulatory Visit (INDEPENDENT_AMBULATORY_CARE_PROVIDER_SITE_OTHER): Payer: 59 | Admitting: Family Medicine

## 2021-02-03 ENCOUNTER — Other Ambulatory Visit: Payer: Self-pay

## 2021-02-03 VITALS — BP 102/70 | HR 67 | Ht 62.0 in | Wt 112.0 lb

## 2021-02-03 DIAGNOSIS — M7061 Trochanteric bursitis, right hip: Secondary | ICD-10-CM | POA: Diagnosis not present

## 2021-02-03 DIAGNOSIS — M5431 Sciatica, right side: Secondary | ICD-10-CM

## 2021-02-03 DIAGNOSIS — M4317 Spondylolisthesis, lumbosacral region: Secondary | ICD-10-CM | POA: Insufficient documentation

## 2021-02-03 DIAGNOSIS — G5701 Lesion of sciatic nerve, right lower limb: Secondary | ICD-10-CM | POA: Diagnosis not present

## 2021-02-03 MED ORDER — GABAPENTIN 300 MG PO CAPS: 300.0000 mg | ORAL_CAPSULE | Freq: Three times a day (TID) | ORAL | 2 refills | Status: DC

## 2021-02-03 MED ORDER — TIZANIDINE HCL 4 MG PO TABS: 4.0000 mg | ORAL_TABLET | Freq: Three times a day (TID) | ORAL | 1 refills | Status: DC | PRN

## 2021-02-03 MED ORDER — PREDNISONE 50 MG PO TABS: 50.0000 mg | ORAL_TABLET | Freq: Every day | ORAL | 0 refills | Status: AC

## 2021-02-03 NOTE — Progress Notes (Signed)
Subjective:    CC: R-sided sciatica  I, Christoper Fabian, LAT, ATC, am serving as scribe for Dr. Clementeen Graham.  HPI: Pt is a 46 y/o female presenting w/ c/o R-sided sciatica since this past weekend, beginning on Saturday.  She locates her pain to R glute.  She was previously seen by Dr. Prince Rome at [facility] for L hip and leg pain w/ paresthesias on 12/21/19.  She does [employer] and did clean and jerks on Friday and then did some soft tissue work on Saturday w/ a foam roller and LAX ball.  She notes pain and spasming in her R lower back and glute since Sat w/ radiating pain into her R post thigh and calf.  Low back pain: yes in her R lower back R LE radiating pain: yes in her post leg to her R calf R LE numbness/tingling: no Aggravating factors: sitting; toileting Treatments tried: Meloxicam; heat; ice; hemp CBD topical   Diagnostic testing: L-spine XR- 12/21/19  Pertinent review of Systems: No fevers or chills  Relevant historical information: Spondylolisthesis at L5-S1.   Objective:    Vitals:   02/03/21 0928  BP: 102/70  Pulse: 67   General: Well Developed, well nourished, and in no acute distress.   MSK: L-spine normal. Nontender midline. Decreased lumbar motion.  Pain with extension.  Significant guarding with lumbar motion. Lower extremity strength testing difficult as patient has a lot of guarding but generally intact except noted below. Reflexes lower extremity generally intact but again lots of guarding reduces accuracy. Sensation is intact distally bilateral lower extremities.  Right hip normal-appearing Tender palpation midportion of piriformis and gluteus medius tendons about where the piriformis crosses over the sciatic nerve. Hip range of motion is intact but painful to crossover stretch and figure 4 stretch. Hip strength testing is intact to external rotation strength testing.  Week 4/5 and very painful to resisted abduction strength testing.  Antalgic  gait.  Lab and Radiology Results  X-ray images L-spine and right hip obtained today personally and independently interpreted  L-spine: Grade 1 spondylolisthesis L5-S1.  Not much progressed from x-ray 2021.  No acute fractures.  Right hip: Surgical staples in the pelvis.  No acute fractures.  No significant degenerative changes.  Await formal radiology review  Procedure: Real-time Ultrasound Guided Injection of right lateral hip origin piriformis greater trochanter Device: Philips Affiniti 50G Images permanently stored and available for review in PACS Sent evaluation prior to injection reveals no obvious large muscle or tendon tear.  Point of maximum tenderness is the location where the piriformis crosses over the sciatic nerve. Verbal informed consent obtained.  Discussed risks and benefits of procedure. Warned about infection bleeding damage to structures skin hypopigmentation and fat atrophy among others. Patient expresses understanding and agreement Time-out conducted.   Noted no overlying erythema, induration, or other signs of local infection.   Skin prepped in a sterile fashion.   Local anesthesia: Topical Ethyl chloride.   With sterile technique and under real time ultrasound guidance: 40 mg of Kenalog and 2 mL of Marcaine injected into origin piriformis and greater trochanter. Fluid seen entering the greater trochanter and piriformis origin.   Completed without difficulty   Pain moderately  resolved suggesting accurate placement of the medication.   Advised to call if fevers/chills, erythema, induration, drainage, or persistent bleeding.   Images permanently stored and available for review in the ultrasound unit.  Impression: Technically successful ultrasound guided injection.       Impression  and Recommendations:    Assessment and Plan: 46 y.o. female with severe right lateral hip and posterior buttocks pain with pain radiating down the right leg. This is either right  L5 radiculopathy or right piriformis syndrome or both lumbar radiculopathy at L5 with gluteus or piriformis muscle strain.. She is in severe pain and not able to get her self better with her normal techniques.  Plan today to maximize treatment options and treat for both lumbar radiculopathy and potential piriformis syndrome/gluteus muscle strain.  We will treat with direct injection at piriformis muscle insertion onto trochanter.  Her area of maximum pain is really where the piriformis crosses over the sciatic nerve.  I was reluctant to inject this area as there is a good chance that I would block the sciatic nerve which was something that both Nan and myself were unwilling to risk today.  Additionally will use gabapentin and tizanidine for symptom control temporarily.  Will refer to physical therapy and obtain x-rays.  I have prescribed prednisone to use as a backup plan in case her symptoms or not resolving on their own with the above treatment.  Recheck in 6 weeks for check back sooner if needed.  PDMP not reviewed this encounter. Orders Placed This Encounter  Procedures   DG Lumbar Spine Complete    Standing Status:   Future    Number of Occurrences:   1    Standing Expiration Date:   03/06/2021    Order Specific Question:   Reason for Exam (SYMPTOM  OR DIAGNOSIS REQUIRED)    Answer:   low back pain    Order Specific Question:   Is patient pregnant?    Answer:   No    Order Specific Question:   Preferred imaging location?    Answer:   [facility]   DG HIP UNILAT W OR W/O PELVIS 2-3 VIEWS RIGHT    Standing Status:   Future    Number of Occurrences:   1    Standing Expiration Date:   03/06/2021    Order Specific Question:   Reason for Exam (SYMPTOM  OR DIAGNOSIS REQUIRED)    Answer:   R hip pain    Order Specific Question:   Is patient pregnant?    Answer:   No    Order Specific Question:   Preferred imaging location?    Answer:   [facility]   Ambulatory  referral to Physical Therapy    Referral Priority:   Routine    Referral Type:   Physical Medicine    Referral Reason:   Specialty Services Required    Requested Specialty:   Physical Therapy    Number of Visits Requested:   1   Meds ordered this encounter  Medications   tiZANidine (ZANAFLEX) 4 MG tablet    Sig: Take 1 tablet (4 mg total) by mouth every 8 (eight) hours as needed for muscle spasms.    Dispense:  30 tablet    Refill:  1   gabapentin (NEURONTIN) 300 MG capsule    Sig: Take 1 capsule (300 mg total) by mouth 3 (three) times daily.    Dispense:  90 capsule    Refill:  2   predniSONE (DELTASONE) 50 MG tablet    Sig: Take 1 tablet (50 mg total) by mouth daily with breakfast for 5 days.    Dispense:  5 tablet    Refill:  0    Discussed warning signs or symptoms. Please see  discharge instructions. Patient expresses understanding.   The above documentation has been reviewed and is accurate and complete Lynne Leader, M.D.

## 2021-02-03 NOTE — Patient Instructions (Addendum)
Nice to meet you today.  You had a R hip (greater trochanter) injection. Call or go to the ER if you develop a large red swollen joint with extreme pain or oozing puss.   I've referred you to Physical Therapy.  That office will call you to schedule but please let us know if you haven't heard from them in one week regarding scheduling.  I've prescribed you Tizanidine, Gabapentin and a short course of prednisone.  Please get an Xray today before you leave.  Follow-up: 6 weeks

## 2021-02-04 ENCOUNTER — Telehealth: Payer: Self-pay | Admitting: Family Medicine

## 2021-02-04 DIAGNOSIS — M5431 Sciatica, right side: Secondary | ICD-10-CM

## 2021-02-04 DIAGNOSIS — M4317 Spondylolisthesis, lumbosacral region: Secondary | ICD-10-CM

## 2021-02-04 DIAGNOSIS — M7061 Trochanteric bursitis, right hip: Secondary | ICD-10-CM

## 2021-02-04 NOTE — Progress Notes (Signed)
Lumbar spine x-ray shows spondylolisthesis at L5-S1.  Stable from 2021.

## 2021-02-04 NOTE — Telephone Encounter (Signed)
Pt husband called, pt is no better since yesterday's visit. Has taken meds as prescribed with no relief. Pt and husband unsure what expectations were set for the meds to help.  Please call pt with any advice or expectations. Pt unable to start PT until some improvement and is inquiring about an MRI.

## 2021-02-04 NOTE — Progress Notes (Signed)
Right hip x-ray shows mild hip arthritis.  I do not think that is the source of your pain.

## 2021-02-04 NOTE — Telephone Encounter (Signed)
Due to the volume of questions this phone call will generate, please call the pt directly to answer their questions.

## 2021-02-04 NOTE — Telephone Encounter (Signed)
Brittany Rogers is miserable with pain.  The injection did not help and the tizanidine and gabapentin are not very helpful.  She is already tried starting the prednisone and that is not helping either.  Based on the severe pain and weakness we will proceed to MRI lumbar spine and hip.  Differential diagnosis includes lumbar radiculopathy originating from her spondylolisthesis and piriformis muscle injury not seen on ultrasound.  Plan for MRI hip and lumbar spine and recheck after MRI or proceed to physical therapy based on results.

## 2021-02-05 ENCOUNTER — Telehealth: Payer: Self-pay | Admitting: Family Medicine

## 2021-02-05 ENCOUNTER — Encounter: Payer: Self-pay | Admitting: Family Medicine

## 2021-02-05 NOTE — Telephone Encounter (Signed)
I completed a peer to peer authorization for both the lumbar spine and hip MRIs.  Authorization valid until April 11, 2021.  Authorization code: [id number]

## 2021-02-06 MED ORDER — HYDROCODONE-ACETAMINOPHEN 5-325 MG PO TABS: 1.0000 | ORAL_TABLET | Freq: Four times a day (QID) | ORAL | 0 refills | Status: DC | PRN

## 2021-02-09 ENCOUNTER — Other Ambulatory Visit: Payer: Self-pay

## 2021-02-09 ENCOUNTER — Ambulatory Visit (INDEPENDENT_AMBULATORY_CARE_PROVIDER_SITE_OTHER): Payer: 59

## 2021-02-09 ENCOUNTER — Other Ambulatory Visit: Payer: Self-pay | Admitting: Family Medicine

## 2021-02-09 DIAGNOSIS — M5431 Sciatica, right side: Secondary | ICD-10-CM

## 2021-02-09 DIAGNOSIS — M4317 Spondylolisthesis, lumbosacral region: Secondary | ICD-10-CM

## 2021-02-09 DIAGNOSIS — M7061 Trochanteric bursitis, right hip: Secondary | ICD-10-CM

## 2021-02-10 ENCOUNTER — Encounter: Payer: Self-pay | Admitting: Family Medicine

## 2021-02-10 ENCOUNTER — Telehealth: Payer: Self-pay | Admitting: Family Medicine

## 2021-02-10 DIAGNOSIS — M4317 Spondylolisthesis, lumbosacral region: Secondary | ICD-10-CM

## 2021-02-10 DIAGNOSIS — M5431 Sciatica, right side: Secondary | ICD-10-CM

## 2021-02-10 MED ORDER — PREDNISONE 5 MG (48) PO TBPK: ORAL_TABLET | ORAL | 0 refills | Status: DC

## 2021-02-10 NOTE — Progress Notes (Signed)
Right hip MRI shows a little bit of hip arthritis and probable labrum tear.  These typically cause anterior hip or groin pain and usually not posterior hip pain so I do not think they are the source of pain.  No visible trochanteric bursitis or piriformis or gluteus muscle tear injury are present.  Based on the results of your lumbar MRI I believe the source of pain is pinched nerve in back.

## 2021-02-10 NOTE — Telephone Encounter (Signed)
Epidural steroid injection ordered 

## 2021-02-10 NOTE — Progress Notes (Signed)
I, Wendy Poet, LAT, ATC, am serving as scribe for Dr. Lynne Leader.  Brittany Rogers is a 46 y.o. female who presents to [place] [facility] today for f/u of R-sided low back and glute pain w/ radiating pain into her R post thigh and calf due to spinal stenosis and spondylolisthesis per MRI.  She was last seen by Dr. Georgina Snell on 02/03/21 and had a R GT steroid injection.  She was also prescribed tizanidine, gabapentin and a short course of prednisone.  She additionally was referred to PT of which she has completed no sessions.  She was ultimately referred for a lumbar spine and R hip MRI following her visit due to severe pain and is here to review those results.  Today, pt reports continued right buttock and leg pain.  She was prescribed a tapering course of prednisone which she started yesterday which is helped a bit although she still has severe pain.  She describes episodes of severe buttocks pain especially with ambulation and pain radiating down her right leg.  Epidural steroid injection scheduled for tomorrow.  Diagnostic testing: R hip and L-spine MRI- 02/09/21; L-spine and R hip XR- 02/03/21  Pertinent review of systems: No fevers or chills  Relevant historical information: Spondylolisthesis L5-S1   Exam:  BP 100/72 (BP Location: Left Arm, Patient Position: Sitting, Cuff Size: Normal)    Pulse 93    Ht 5\' 2"  (1.575 m)    Wt 112 lb (50.8 kg)    LMP 01/13/2021 (Within Days)    SpO2 99%    BMI 20.49 kg/m  General: Well Developed, well nourished, and in no acute distress.   MSK: Decreased lumbar motion. Normal hip motion. Antalgic gait    Lab and Radiology Results No results found for this or any previous visit (from the past 72 hour(s)). MR Lumbar Spine Wo Contrast  Result Date: 02/10/2021 CLINICAL DATA:  Spondylolysis. Gluteal pain for 1 week. Injured while stretching. EXAM: MRI LUMBAR SPINE WITHOUT CONTRAST TECHNIQUE: Multiplanar, multisequence MR imaging of the  lumbar spine was performed. No intravenous contrast was administered. COMPARISON:  Lumbar spine radiographs 12/21/2019 and 02/03/2021 FINDINGS: Segmentation: There are 5 non-rib-bearing lumbar-type vertebral bodies. Alignment: There is 6 mm grade 1 anterolisthesis of L5 on S1, similar to prior radiographs. Vertebrae: Vertebral body heights are maintained. Moderate anterior L5-S1 disc space narrowing. Decreased disc hydration throughout the L4-5 disc level and more mildly at L3-4. Mild anterior right L5-S1 edematous marrow endplate degenerative change. No acute fracture or destructive bone lesion is seen. There is a small lesion containing both increased T1 increased T2/stir signal within the anterior superior left L5 vertebral body. This appears to contain some internal fat, and there appears to be trabecular thickening on axial images suggesting an atypical hemangioma, likely benign. Similar tiny posteroinferior right T12 vertebral body lesion. Conus medullaris and cauda equina: Conus extends to the T12-L1 level. Conus and cauda equina appear normal. Paraspinal and other soft tissues: Limited images of the retroperitoneum are unremarkable. Disc levels: T11-12: Mild right paracentral posterior disc protrusion with mild right lateral recess narrowing. No central canal or neural foraminal stenosis. T12-L1: No posterior disc bulge, central canal narrowing, or neural foraminal stenosis. L1-2: No posterior disc bulge, central canal narrowing, or neural foraminal stenosis. L2-3: Mild bilateral facet joint hypertrophy. Mild posterior endplate spurring. No posterior disc bulge. No central canal or neural foraminal stenosis. L3-4: Mild-to-moderate bilateral facet joint hypertrophy. Mild left-greater-than-right posterior disc bulge with mild left intraforaminal extension.  Mild left neural foraminal stenosis. No central canal stenosis. L4-5: Mild-to-moderate bilateral facet joint hypertrophy. Small midline posterior annular  disc tear. Mild right-greater-than-left posterior disc bulge. Disc mildly contacts the descending right L5 nerve within the right lateral recess. Mild-to-moderate right and mild left lateral recess narrowing. No central canal stenosis. Only borderline mild neural foraminal stenosis bilaterally. L5-S1: Grade 1 anterolisthesis. Moderate to severe bilateral facet joint hypertrophy. Posterior disc uncovering. Mild-to-moderate bilateral intraforaminal disc protrusions and moderate to severe right and moderate left neural foraminal stenosis. No central canal stenosis. There are chronic bilateral L5 pars defects. IMPRESSION:: IMPRESSION: 1. 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 L5-S1 moderate to severe right and moderate left neural foraminal stenosis. 2. L4-5 mild right-greater-than-left posterior disc bulge with mild-to-moderate right and mild left lateral recess narrowing. 3. Mild left L3-4 neural foraminal stenosis. Electronically Signed   By: Yvonne Kendall M.D.   On: 02/10/2021 13:38   MR HIP RIGHT WO CONTRAST  Result Date: 02/10/2021 CLINICAL DATA:  Right gluteal hip pain for 1 week. Injured while stretching. Suspect piriformis muscle injury or tear. EXAM: MR OF THE RIGHT HIP WITHOUT CONTRAST TECHNIQUE: Multiplanar, multisequence MR imaging was performed. No intravenous contrast was administered. COMPARISON:  Pelvis and right hip radiographs 02/03/2021 FINDINGS: Bones: No acute fracture or avascular necrosis. Articular cartilage and labrum Articular cartilage: Mild superior right femoral head and acetabular cartilage thinning greatest anteriorly. Mild right femoral head-neck junction degenerative osteophytes as seen on prior radiographs. No right hip joint effusion. Labrum: There is intermediate T2 signal degenerative change within the anterior superior right acetabular labrum with linear increased signal (sagittal series 7, image 12) that may represent a small tear  within the anterior superior right chondrolabral junction. The left hip is included on large field-of-view images and there appears to be mild superior left femoral head and acetabular cartilage degenerative change. There are small cysts extending laterally from the anterior superior through the posterosuperior left acetabular labrum, likely paralabral cysts related to superior left acetabular labral tears overall measuring up to 0.6 cm in greatest AP dimension, 1.2 cm in transverse dimension, and 1.6 cm in craniocaudal dimension. Joint or bursal effusion Joint effusion:  No hip joint effusion. Bursae: No trochanteric bursitis. Muscles and tendons Muscles and tendons: The origins of the bilateral rectus femoris tendons and the bilateral common hamstring tendon origins are intact. The insertions of the bilateral gluteus minimus gluteus medius, and iliopsoas tendons are intact. The rectus abdominis-adductor aponeuroses are intact. Normal signal and size of the bilateral piriformis and gluteus muscles. Other findings Miscellaneous: The uterus is present. Small bilateral adnexal cysts measuring up to 1.6 cm on the right are incidentally noted. IMPRESSION:: IMPRESSION: 1. Mild right femoroacetabular osteoarthritis. 2. Mild anterior superior right chondrolabral junction nondisplaced tear. 3. Mild left femoroacetabular osteoarthritis with superolateral left acetabular paralabral cysts and likely superior labral tears. 4. No trochanteric bursitis. 5. Normal signal and size of the bilateral piriformis and gluteus muscles. Electronically Signed   By: Yvonne Kendall M.D.   On: 02/10/2021 13:45     I, Lynne Leader, personally (independently) visualized and performed the interpretation of the images attached in this note.   Assessment and Plan: 46 y.o. female with right buttock pain and right lower leg pain thought primarily due to lumbar radiculopathy primarily involving the L5 nerve root. She already has a epidural  steroid injection ordered and scheduled for tomorrow.  Differential diagnosis also includes piriformis syndrome and she is  already been referred to physical therapy but she had to delay the visit because of the epidural steroid injection.  Encouraged her to reschedule physical therapy for after the epidural steroid injection in case it does not work as well as we hoped.  Spent time today reviewing her imaging in detail and discussing differential diagnosis treatment plan and options going forward.  If the epidural steroid injection and physical therapy do not work may consider repeat epidural steroid injection and/or referral to spine surgery for second opinion or surgical consultation.  Total encounter time 40 minutes including face-to-face time with the patient and, reviewing past medical record, and charting on the date of service.   Extensive discussion and review as above.     Discussed warning signs or symptoms. Please see discharge instructions. Patient expresses understanding.   The above documentation has been reviewed and is accurate and complete Lynne Leader, M.D.

## 2021-02-10 NOTE — Progress Notes (Signed)
MRI shows pars defects at L5 with anterior listhesis with medium to severe right and medium left neuroforaminal stenosis.  This is potential for pinched nerve at the L5 nerve root on the right which certainly could cause your leg pain and buttocks pain. Similar problem is present at L4-5 on the right.  I have ordered an epidural steroid injection to treat this problem.  Please contact [facility] radiology at [phone] to get this scheduled. We will talk about this in full detail tomorrow during your visit.

## 2021-02-10 NOTE — Addendum Note (Signed)
Addended by: Rodolph Bong on: 02/10/2021 12:23 PM   Modules accepted: Orders

## 2021-02-11 ENCOUNTER — Other Ambulatory Visit: Payer: Self-pay

## 2021-02-11 ENCOUNTER — Ambulatory Visit (INDEPENDENT_AMBULATORY_CARE_PROVIDER_SITE_OTHER): Payer: 59 | Admitting: Family Medicine

## 2021-02-11 VITALS — BP 100/72 | HR 93 | Ht 62.0 in | Wt 112.0 lb

## 2021-02-11 DIAGNOSIS — M4317 Spondylolisthesis, lumbosacral region: Secondary | ICD-10-CM | POA: Diagnosis not present

## 2021-02-11 DIAGNOSIS — G5701 Lesion of sciatic nerve, right lower limb: Secondary | ICD-10-CM | POA: Diagnosis not present

## 2021-02-11 DIAGNOSIS — M5431 Sciatica, right side: Secondary | ICD-10-CM

## 2021-02-11 NOTE — Patient Instructions (Addendum)
Good to see you today.  Epidural injection is scheduled for tomorrow.  Medication-wise, con't taking the tizanidine and gabapentin as needed but d/c the prednisone.  Reschedule the PT.   Follow up as needed.   Keep me updated.

## 2021-02-12 ENCOUNTER — Encounter: Payer: Self-pay | Admitting: Family Medicine

## 2021-02-12 ENCOUNTER — Other Ambulatory Visit: Payer: Self-pay | Admitting: Family Medicine

## 2021-02-12 ENCOUNTER — Ambulatory Visit
Admission: RE | Admit: 2021-02-12 | Discharge: 2021-02-12 | Disposition: A | Discharge status: Home / Self Care | Payer: 59 | Source: Ambulatory Visit | Attending: Family Medicine | Admitting: Family Medicine

## 2021-02-12 ENCOUNTER — Ambulatory Visit: Payer: 59 | Admitting: Physical Therapy

## 2021-02-12 DIAGNOSIS — M5431 Sciatica, right side: Secondary | ICD-10-CM

## 2021-02-12 DIAGNOSIS — M4317 Spondylolisthesis, lumbosacral region: Secondary | ICD-10-CM

## 2021-02-12 MED ORDER — IOPAMIDOL (ISOVUE-M 200) INJECTION 41%
1.0000 mL | Freq: Once | INTRAMUSCULAR | Status: AC
  Administered 2021-02-12: 1 mL via EPIDURAL

## 2021-02-12 MED ORDER — METHYLPREDNISOLONE ACETATE 40 MG/ML INJ SUSP (RADIOLOG
80.0000 mg | Freq: Once | INTRAMUSCULAR | Status: AC
  Administered 2021-02-12: 80 mg via EPIDURAL

## 2021-02-12 NOTE — Discharge Instructions (Signed)

## 2021-02-14 ENCOUNTER — Other Ambulatory Visit: Payer: Self-pay

## 2021-02-14 ENCOUNTER — Ambulatory Visit (INDEPENDENT_AMBULATORY_CARE_PROVIDER_SITE_OTHER): Payer: 59 | Admitting: Physical Therapy

## 2021-02-14 DIAGNOSIS — M5441 Lumbago with sciatica, right side: Secondary | ICD-10-CM

## 2021-02-14 DIAGNOSIS — M25551 Pain in right hip: Secondary | ICD-10-CM | POA: Diagnosis not present

## 2021-02-14 NOTE — Therapy (Signed)
OUTPATIENT PHYSICAL THERAPY  EVALUATION   Patient Name: Brittany Rogers MRN: [id number] DOB:07-May-1975, 46 y.o., female Today's Date: 02/16/2021   PT End of Session - 02/16/21 1643     Visit Number 1    Number of Visits 18    Date for PT Re-Evaluation 03/28/21    Authorization Type [employer]    PT Start Time [id number]    PT Stop Time [id number]    PT Time Calculation (min) 50 min    Activity Tolerance Patient tolerated treatment well    Behavior During Therapy WFL for tasks assessed/performed             Past Medical History:  Diagnosis Date   Abdominal pain    Acute otitis media    Adrenal insufficiency (Addison's disease) (HCC)    Candida vaginitis    Cellulitis of nasal tip    Conjunctivitis    Cough    Depression    Diastasis recti    GAD (generalized anxiety disorder)    Hypothyroid    Irregular heart rate    Migraine    Murmur    Nausea    Nipple dermatitis    Otitis externa    Panic attack    Pharyngitis    Pneumonia due to COVID-19 virus    Sciatica    Sinusitis    Spondylisthesis    Umbilical hernia    UTI (urinary tract infection)    Vomiting    History reviewed. No pertinent surgical history. 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 She is very active working out, at [employer] . Has had on/off pain, but nothing like current pain, or pain that did not resolve. She has had recent imaging as well as recent injection 2 days ago. States Pain in bil glutes only, no back  pain. Pain in R glute > L, down into R thigh. She has not been able to do regular activity or exercise, due to pain.    PERTINENT HISTORY:  Previous abdominal surgery for DR.  PAIN:  Are you having pain? Yes Radiates into  R LE to knee.  NPRS scale: 4-5/10,  up  to 8/10 Pain location: Bil glutes. No back pain.  Pain orientation: Right and Left  PAIN TYPE: aching and burning Pain description: intermittent  Aggravating factors: bending, increased activity Relieving factors: quadruped or laying flat.   PRECAUTIONS: None  WEIGHT BEARING RESTRICTIONS No  FALLS:  Has patient fallen in last 6 months? No, Number of falls:   PLOF: Independent  PATIENT GOALS  decreased pain    OBJECTIVE:   DIAGNOSTIC FINDINGS:  MRI of lumbar spine: Chronic bilateral L5 pars defects with chronic grade 1 anterolisthesis of L5 on S1. L5-S1 moderate to severe right and moderate left neural foraminal stenosis. MRI of hips: mild hip arthritis and probable labrum tear, bilaterally    SCREENING FOR RED FLAGS:   NEG  COGNITION:  Overall cognitive status: Within functional limits for tasks assessed    PALPATION: Tightness in R HS with nerve tension, Stiffness and limitation for thoracic and lumbar spine with PA s. Trigger points and tenderness in bil R>L glute med, min, R piriformis;   LUMBARAROM/PROM  A/PROM A/PROM  02/14/21  Flexion Significantly limited/pain  Extension Significantly limited/pain  Right lateral flexion WFL  Left lateral flexion WFL  Right rotation   Left rotation    (Blank rows = not tested)  LE AROM/PROM:   02/14/21: Hips: WFL   LE MMT:   02/14/21: Hips: 4+/5   LUMBAR SPECIAL TESTS:  + SLR, + hamstring tightness on R with nerve tension,    TODAY'S TREATMENT   02/16/21: Therapeutic Exercise: Aerobic: Supine: Seated: Standing: Stretches: pelvic tilts x 15; DKTC 30 sec x 3; LTR x 15; Cat cow x 15; Childs pose 30 sec x 3 L, R, Center Neuromuscular Re-education: Manual  Therapy: Long leg distraction for lumbar pump x 3 min;     PATIENT EDUCATION:  Education details: PT POC, Exam findings, Stenosis and directional preference for activity, activities to continue at gym and what to avoid.  Person educated: Patient Education method: Explanation, Demonstration, Tactile cues, Verbal cues, and Handouts Education comprehension: verbalized understanding, returned demonstration, verbal cues required, tactile cues required, and needs further education   HOME EXERCISE PROGRAM: [id number]  ASSESSMENT:  CLINICAL IMPRESSION: Pt presents with primary complaint of increased pain in bil hips/glutes, consistent with lumbar radiculopathy. Pt with stiffness in thoracic and lumbar spine. She has significant limitation for flexion and extension ROM. She has decreased ability for full functional activities, bend, lift, squat, and exercise ability, due to pain and dysfunction. Pt to benefit from skilled PT to improve deficits and pain.   Objective impairments include decreased activity tolerance, decreased endurance, decreased mobility, decreased ROM, decreased strength, hypomobility, increased muscle spasms, impaired flexibility, improper body mechanics, and pain. These impairments are limiting patient from cleaning, community activity, driving, meal prep, occupation, laundry, yard work, and shopping. Personal factors including none are also affecting patient's functional outcome. Patient will benefit from skilled PT to address above impairments and improve overall function.  REHAB POTENTIAL: Good  CLINICAL DECISION MAKING: Stable/uncomplicated  EVALUATION COMPLEXITY: Low   GOALS: Goals reviewed with patient? Yes  SHORT TERM GOALS:  STG Name Target Date Goal status  1 Patient will be independent with initial HEP   02/28/21 INITIAL  2 Pt to demo improved ROM for  L - spine flexion by at least 25 deg 02/28/21 INITIAL         LONG TERM GOALS:   LTG Name Target Date Goal  status  1  Patient will be independent with final HEP  03/28/21 INITIAL  2 Pt to report decreased pain in bil hips/glutes  to 0-2/10   03/28/21 INITIAL  3 Pt to demonstrate improved lumbar ROM to be WNL for flexion, to improve ability for ADLs and IADLs.   03/28/21 INITIAL  4 Pt to demonstrate optimal mechanics and ability for bend, lift, squat without pain, to improve ability for  exercise and IADLs.   03/28/21 INITIAL  5        PLAN: PT FREQUENCY: 1-2x/week  PT DURATION: 8 weeks  PLANNED INTERVENTIONS: Therapeutic exercises, Therapeutic activity, Neuro Muscular re-education, Balance training, Gait training, Patient/Family education, Joint mobilization, Stair training, DME instructions, Dry Needling, Electrical stimulation, Spinal mobilization, Cryotherapy, Moist heat, Taping, Traction, Ultrasound, Ionotophoresis 4mg /ml Dexamethasone, and Manual therapy  PLAN FOR NEXT SESSION:  Review of core strength.  Lyndee Hensen, PT, DPT 4:57 PM  02/16/21

## 2021-02-16 ENCOUNTER — Encounter: Payer: Self-pay | Admitting: Physical Therapy

## 2021-02-19 ENCOUNTER — Other Ambulatory Visit: Payer: Self-pay

## 2021-02-19 ENCOUNTER — Ambulatory Visit (INDEPENDENT_AMBULATORY_CARE_PROVIDER_SITE_OTHER): Payer: 59 | Admitting: Physical Therapy

## 2021-02-19 ENCOUNTER — Encounter: Payer: Self-pay | Admitting: Physical Therapy

## 2021-02-19 DIAGNOSIS — M25551 Pain in right hip: Secondary | ICD-10-CM

## 2021-02-19 DIAGNOSIS — M5441 Lumbago with sciatica, right side: Secondary | ICD-10-CM | POA: Diagnosis not present

## 2021-02-19 NOTE — Therapy (Signed)
OUTPATIENT PHYSICAL THERAPY  TREATMENT   Patient Name: Brittany Rogers MRN: [id number] DOB:[birth year], 46 y.o., female Today's Date: 02/19/2021   PT End of Session - 02/19/21 1357     Visit Number 2    Number of Visits 18    Date for PT Re-Evaluation 03/28/21    Authorization Type [employer]    PT Start Time 1300    PT Stop Time 1343    PT Time Calculation (min) 43 min    Activity Tolerance Patient tolerated treatment well    Behavior During Therapy WFL for tasks assessed/performed              Past Medical History:  Diagnosis Date   Abdominal pain    Acute otitis media    Adrenal insufficiency (Addison's disease) (HCC)    Candida vaginitis    Cellulitis of nasal tip    Conjunctivitis    Cough    Depression    Diastasis recti    GAD (generalized anxiety disorder)    Hypothyroid    Irregular heart rate    Migraine    Murmur    Nausea    Nipple dermatitis    Otitis externa    Panic attack    Pharyngitis    Pneumonia due to COVID-19 virus    Sciatica    Sinusitis    Spondylisthesis    Umbilical hernia    UTI (urinary tract infection)    Vomiting    History reviewed. No pertinent surgical history. Patient Active Problem List   Diagnosis Date Noted   Spondylolisthesis at L5-S1 level 02/03/2021    PCP: Pcp, No  REFERRING PROVIDER: No ref. provider found  REFERRING DIAG: R sciatica  THERAPY DIAG:  Acute bilateral low back pain with right-sided sciatica  Pain in right hip  ONSET DATE: 02/05/21  SUBJECTIVE:                                                                                                                                                                                           SUBJECTIVE STATEMENT: Pt states some decrease in pain and radicular pain. Has been able to exercise, lighter.    PERTINENT HISTORY:  Previous abdominal surgery for DR.  PAIN:  Are you having pain? Yes Radiates into R LE to knee.  NPRS scale: 3-5/10,   Pain location:  Bil glutes. No back pain.  Pain orientation: Right and Left  PAIN TYPE: aching and burning Pain description: intermittent  Aggravating factors: bending, increased activity Relieving factors: quadruped or laying flat.   PRECAUTIONS: None  WEIGHT BEARING RESTRICTIONS No  FALLS:  Has patient fallen in  last 6 months? No, Number of falls:   PLOF: Independent  PATIENT GOALS  decreased pain    OBJECTIVE:   DIAGNOSTIC FINDINGS:  MRI of lumbar spine: Chronic bilateral L5 pars defects with chronic grade 1 anterolisthesis of L5 on S1. L5-S1 moderate to severe right and moderate left neural foraminal stenosis. MRI of hips: mild hip arthritis and probable labrum tear, bilaterally    SCREENING FOR RED FLAGS:   NEG  COGNITION:  Overall cognitive status: Within functional limits for tasks assessed    PALPATION: Tightness in R HS with nerve tension, Stiffness and limitation for thoracic and lumbar spine with PA s. Trigger points and tenderness in bil R>L glute med, min, R piriformis;   LUMBARAROM/PROM  A/PROM A/PROM  02/14/21  Flexion Significantly limited/pain  Extension Significantly limited/pain  Right lateral flexion WFL  Left lateral flexion WFL  Right rotation   Left rotation    (Blank rows = not tested)  LE AROM/PROM:   02/14/21: Hips: WFL   LE MMT:   02/14/21: Hips: 4+/5   LUMBAR SPECIAL TESTS:  + SLR, + hamstring tightness on R with nerve tension,    TODAY'S TREATMENT  02/19/21: Therapeutic Exercise: Aerobic: Supine: Bridging x 20; SLR x 10 bil; High planks x 1 min, reviewed bird dog for HEP,  Seated: Standing: Stretches: pelvic tilts x 15; DKTC 30 sec x 3; Neuromuscular Re-education: Manual Therapy: Long leg distraction for lumbar pump x 2 min x 2; STM/DTM to bil lumbar paraspinals and R glute med.      02/16/21: Therapeutic Exercise: Aerobic: Supine: Seated: Standing: Stretches: pelvic tilts x 15; DKTC 30 sec x 3; LTR x 15; Cat cow x 15; Childs pose 30  sec x 3 L, R, Center Neuromuscular Re-education: Manual Therapy: Long leg distraction for lumbar pump x 3 min;     PATIENT EDUCATION:  Education details: Updated HEP, discussed safe core exercise.  Person educated: Patient Education method: Explanation, Demonstration, Tactile cues, Verbal cues, and Handouts Education comprehension: verbalized understanding, returned demonstration, verbal cues required, tactile cues required, and needs further education   HOME EXERCISE PROGRAM: [id number]  ASSESSMENT:  CLINICAL IMPRESSION: Overall pt having less pain. Pt with tightness and soreness in bil lumbar musculature and R glute musculature, addressed with manual today. Pt with increased radicular pain with initial standing/loading. Reviewed best core strengthening exercises today, for decreased lumbar pressure.   Objective impairments include decreased activity tolerance, decreased endurance, decreased mobility, decreased ROM, decreased strength, hypomobility, increased muscle spasms, impaired flexibility, improper body mechanics, and pain. These impairments are limiting patient from cleaning, community activity, driving, meal prep, occupation, laundry, yard work, and shopping. Personal factors including none are also affecting patient's functional outcome. Patient will benefit from skilled PT to address above impairments and improve overall function.  REHAB POTENTIAL: Good  CLINICAL DECISION MAKING: Stable/uncomplicated  EVALUATION COMPLEXITY: Low   GOALS: Goals reviewed with patient? Yes  SHORT TERM GOALS:  STG Name Target Date Goal status  1 Patient will be independent with initial HEP   02/28/21 INITIAL  2 Pt to demo improved ROM for  L - spine flexion by at least 25 deg 02/28/21 INITIAL         LONG TERM GOALS:   LTG Name Target Date Goal status  1 Patient will be independent with final HEP  03/28/21 INITIAL  2 Pt to report decreased pain in bil hips/glutes  to 0-2/10   03/28/21  INITIAL  3 Pt to demonstrate improved lumbar ROM  to be WNL for flexion, to improve ability for ADLs and IADLs.   03/28/21 INITIAL  4 Pt to demonstrate optimal mechanics and ability for bend, lift, squat without pain, to improve ability for  exercise and IADLs.   03/28/21 INITIAL  5        PLAN: PT FREQUENCY: 1-2x/week  PT DURATION: 8 weeks  PLANNED INTERVENTIONS: Therapeutic exercises, Therapeutic activity, Neuro Muscular re-education, Balance training, Gait training, Patient/Family education, Joint mobilization, Stair training, DME instructions, Dry Needling, Electrical stimulation, Spinal mobilization, Cryotherapy, Moist heat, Taping, Traction, Ultrasound, Ionotophoresis 4mg /ml Dexamethasone, and Manual therapy  PLAN FOR NEXT SESSION:  Review of core strength.  Lyndee Hensen, PT, DPT 2:00 PM  02/19/21

## 2021-02-25 ENCOUNTER — Encounter: Payer: 59 | Admitting: Physical Therapy

## 2021-03-05 ENCOUNTER — Ambulatory Visit: Payer: 59 | Admitting: Podiatry

## 2021-03-05 ENCOUNTER — Ambulatory Visit (INDEPENDENT_AMBULATORY_CARE_PROVIDER_SITE_OTHER): Payer: 59 | Admitting: Physical Therapy

## 2021-03-05 ENCOUNTER — Other Ambulatory Visit: Payer: Self-pay

## 2021-03-05 DIAGNOSIS — M25551 Pain in right hip: Secondary | ICD-10-CM | POA: Diagnosis not present

## 2021-03-05 DIAGNOSIS — M5441 Lumbago with sciatica, right side: Secondary | ICD-10-CM | POA: Diagnosis not present

## 2021-03-05 NOTE — Therapy (Signed)
OUTPATIENT PHYSICAL THERAPY  TREATMENT   Patient Name: Brittany Rogers MRN: [id number] DOB:12-Feb-1975, 46 y.o., female Today's Date: 03/05/2021   PT End of Session - 03/06/21 1739     Visit Number 3    Number of Visits 18    Date for PT Re-Evaluation 03/28/21    Authorization Type [employer]    PT Start Time 1305    PT Stop Time 1345    PT Time Calculation (min) 40 min    Activity Tolerance Patient tolerated treatment well    Behavior During Therapy WFL for tasks assessed/performed               Past Medical History:  Diagnosis Date   Abdominal pain    Acute otitis media    Adrenal insufficiency (Addison's disease) (HCC)    Candida vaginitis    Cellulitis of nasal tip    Conjunctivitis    Cough    Depression    Diastasis recti    GAD (generalized anxiety disorder)    Hypothyroid    Irregular heart rate    Migraine    Murmur    Nausea    Nipple dermatitis    Otitis externa    Panic attack    Pharyngitis    Pneumonia due to COVID-19 virus    Sciatica    Sinusitis    Spondylisthesis    Umbilical hernia    UTI (urinary tract infection)    Vomiting    History reviewed. No pertinent surgical history. Patient Active Problem List   Diagnosis Date Noted   Spondylolisthesis at L5-S1 level 02/03/2021    PCP: Pcp, No  REFERRING PROVIDER: Clementeen Graham   REFERRING DIAG: R sciatica  THERAPY DIAG:  Acute bilateral low back pain with right-sided sciatica  Pain in right hip  ONSET DATE: 02/05/21  SUBJECTIVE:                                                                                                                                                                                           SUBJECTIVE STATEMENT: Pt just finishing new steroid pack, 12 days, reports much improved pain in back and LE. She does have new pain in L foot, has appt with podiatry this week.    PERTINENT HISTORY:  Previous abdominal surgery for DR.  PAIN:  Are you having pain? Yes Radiates  into R LE to knee.  NPRS scale: 3-5/10,   Pain location: Bil glutes. No back pain.  Pain orientation: Right and Left  PAIN TYPE: aching and burning Pain description: intermittent  Aggravating factors: bending, increased activity Relieving factors: quadruped or laying flat.  PRECAUTIONS: None  WEIGHT BEARING RESTRICTIONS No  FALLS:  Has patient fallen in last 6 months? No, Number of falls:   PLOF: Independent  PATIENT GOALS  decreased pain    OBJECTIVE:   DIAGNOSTIC FINDINGS:  MRI of lumbar spine: Chronic bilateral L5 pars defects with chronic grade 1 anterolisthesis of L5 on S1. L5-S1 moderate to severe right and moderate left neural foraminal stenosis. MRI of hips: mild hip arthritis and probable labrum tear, bilaterally    SCREENING FOR RED FLAGS:   NEG  COGNITION:  Overall cognitive status: Within functional limits for tasks assessed    PALPATION: Tightness in R HS with nerve tension, Stiffness and limitation for thoracic and lumbar spine with PA s. Trigger points and tenderness in bil R>L glute med, min, R piriformis;   LUMBARAROM/PROM  A/PROM A/PROM  02/14/21  Flexion Significantly limited/pain  Extension Significantly limited/pain  Right lateral flexion WFL  Left lateral flexion WFL  Right rotation   Left rotation    (Blank rows = not tested)  LE AROM/PROM:   02/14/21: Hips: WFL   LE MMT:   02/14/21: Hips: 4+/5   LUMBAR SPECIAL TESTS:  + SLR, + hamstring tightness on R with nerve tension,    TODAY'S TREATMENT   03/05/21: Therapeutic Exercise: Aerobic: Supine: Bridging x 20;  SLR x 20 bil;  High planks x 1 min,  Quadruped: bird dog x 20;  Seated: Standing:  Dead lift 25 lb x 20 with education on form; Squats 25 lb with education on form.  Stretches: pelvic tilts x 15;   SKTC 30 sec x 3; Neuromuscular Re-education: Manual Therapy:  Long leg distraction for lumbar pump     02/19/21: Therapeutic Exercise: Aerobic: Supine: Bridging x 20; SLR x  10 bil; High planks x 1 min, reviewed bird dog for HEP,  Seated: Standing: Stretches: pelvic tilts x 15; DKTC 30 sec x 3; Neuromuscular Re-education: Manual Therapy: Long leg distraction for lumbar pump x 2 min x 2; STM/DTM to bil lumbar paraspinals and R glute med.      02/16/21: Therapeutic Exercise: Aerobic: Supine: Seated: Standing: Stretches: pelvic tilts x 15; DKTC 30 sec x 3; LTR x 15; Cat cow x 15; Childs pose 30 sec x 3 L, R, Center Neuromuscular Re-education: Manual Therapy: Long leg distraction for lumbar pump x 3 min;     PATIENT EDUCATION:  Education details: Updated HEP, discussed safe core exercise.  Person educated: Patient Education method: Explanation, Demonstration, Tactile cues, Verbal cues, and Handouts Education comprehension: verbalized understanding, returned demonstration, verbal cues required, tactile cues required, and needs further education   HOME EXERCISE PROGRAM: [id number]  ASSESSMENT:  CLINICAL IMPRESSION: 03/06/21: Pt with improving pain levels. Able to progress exercise today without increased pain. Discussed continuing to be more careful with exercise movements and to stay lighter with weight when loading back/spine. Discussed taping for L foot pain, and icing to decrease pain and inflammation. Recommended pt get appt with sports med in another 1-2 days if pain in foot does not decrease. Plan to continue strength as tolerated, as well as manual and decompression as needed.   Objective impairments include decreased activity tolerance, decreased endurance, decreased mobility, decreased ROM, decreased strength, hypomobility, increased muscle spasms, impaired flexibility, improper body mechanics, and pain. These impairments are limiting patient from cleaning, community activity, driving, meal prep, occupation, laundry, yard work, and shopping. Personal factors including none are also affecting patient's functional outcome. Patient will benefit from  skilled PT to address above  impairments and improve overall function.  REHAB POTENTIAL: Good  CLINICAL DECISION MAKING: Stable/uncomplicated  EVALUATION COMPLEXITY: Low   GOALS: Goals reviewed with patient? Yes  SHORT TERM GOALS:  STG Name Target Date Goal status  1 Patient will be independent with initial HEP   02/28/21 INITIAL  2 Pt to demo improved ROM for  L - spine flexion by at least 25 deg 02/28/21 INITIAL         LONG TERM GOALS:   LTG Name Target Date Goal status  1 Patient will be independent with final HEP  03/28/21 INITIAL  2 Pt to report decreased pain in bil hips/glutes  to 0-2/10   03/28/21 INITIAL  3 Pt to demonstrate improved lumbar ROM to be WNL for flexion, to improve ability for ADLs and IADLs.   03/28/21 INITIAL  4 Pt to demonstrate optimal mechanics and ability for bend, lift, squat without pain, to improve ability for  exercise and IADLs.   03/28/21 INITIAL  5        PLAN: PT FREQUENCY: 1-2x/week  PT DURATION: 8 weeks  PLANNED INTERVENTIONS: Therapeutic exercises, Therapeutic activity, Neuro Muscular re-education, Balance training, Gait training, Patient/Family education, Joint mobilization, Stair training, DME instructions, Dry Needling, Electrical stimulation, Spinal mobilization, Cryotherapy, Moist heat, Taping, Traction, Ultrasound, Ionotophoresis 4mg /ml Dexamethasone, and Manual therapy  PLAN FOR NEXT SESSION:  Review of core strength.  , PT, DPT 5:40 PM  03/06/21

## 2021-03-06 ENCOUNTER — Encounter: Payer: Self-pay | Admitting: Physical Therapy

## 2021-03-06 NOTE — Progress Notes (Signed)
I, Wendy Poet, LAT, ATC, am serving as scribe for Dr. Lynne Leader.  Brittany Rogers is a 46 y.o. female who presents to [place] at [facility] today for L foot pain.  She was last seen by Dr. Georgina Snell on 02/11/21 for f/u of LBP w/ R LE radicular pain due to spinal stenosis and spondylolisthesis per MRI.  Today, pt reports L foot pain and swelling since Tuesday w/ no known MOI.  She locates her pain to her L medial calcaneus and proximal medial longitudinal arch.  L foot swelling: yes Aggravating factors: weight-bearing activity; tenderness to palpation/pressure Treatments tried: ice; kinesiotape; heat; prior prednisone for her back   Pertinent review of systems: No fevers or chills  Relevant historical information: Spondylolisthesis   Exam:  BP 100/68 (BP Location: Left Arm, Patient Position: Sitting, Cuff Size: Normal)    Pulse 74    Ht 5\' 2"  (1.575 m)    Wt 110 lb (49.9 kg)    LMP 02/19/2021    SpO2 99%    BMI 20.12 kg/m  General: Well Developed, well nourished, and in no acute distress.   MSK: Left foot short foot and mild swelling at medial hindfoot just superior to the medial calcaneus inferior of the tarsal tunnel area and navicular prominence. Normal foot and ankle motion. Not much pain or no pain with resisted foot inversion. Some pain with standing present. No pain with toe curl or resisted great toe plantarflexion. Pulses capillary fill and sensation are intact distally.    Lab and Radiology Results  Diagnostic Limited MSK Ultrasound of: Left foot medial Medial ankle:  At just posterior to the medial malleolus the posterior tibialis tendon and other medial ankle tendons are normal-appearing The tarsal tunnel tendons are normal-appearing and the insertion of the posterior tibialis tendon at the navicular prominence does have some mild hyperechoic changes but no tear is visible.  She is nontender to palpation in this area. There is some mild swelling soft  tissue present at area of tenderness just superior to the medial calcaneus but no definitive tendon or muscle tear is present. Plantar fascia is normal-appearing Impression: Soft tissue injury or heel strain medial superior calcaneus possible spring ligament strain.  X-ray images left calcaneus obtained today personally and independently interpreted Well-appearing calcaneus.  No fractures.  No significant degenerative changes. Await formal radiology review    Assessment and Plan: 46 y.o. female with medial plantar foot pain thought to be due to strain of the spring ligament or contusion or injury to the intrinsic foot musculature in this area.  It is also possible pain is due to a heel capsule strain.  Plan for treatment with scaphoid pad and arch strap and Voltaren gel.  She felt a lot better after a trial of a scaphoid pad which is reassuring.  Plan for a bit of watchful waiting with this treatment and check back in a month.  If not improved certainly there is more to do.   PDMP not reviewed this encounter. Orders Placed This Encounter  Procedures   [place] LIMITED JOINT SPACE STRUCTURES LOW LEFT(NO LINKED CHARGES)    Order Specific Question:   Reason for Exam (SYMPTOM  OR DIAGNOSIS REQUIRED)    Answer:   L heel pain    Order Specific Question:   Preferred imaging location?    Answer:   [place]   DG Os Calcis Left    Standing Status:   Future    Number of  Occurrences:   1    Standing Expiration Date:   04/04/2021    Order Specific Question:   Reason for Exam (SYMPTOM  OR DIAGNOSIS REQUIRED)    Answer:   L heel pain    Order Specific Question:   Is patient pregnant?    Answer:   No    Order Specific Question:   Preferred imaging location?    Answer:   [facility]   No orders of the defined types were placed in this encounter.    Discussed warning signs or symptoms. Please see discharge instructions. Patient expresses understanding.   The above  documentation has been reviewed and is accurate and complete Lynne Leader, M.D.

## 2021-03-07 ENCOUNTER — Ambulatory Visit: Payer: Self-pay

## 2021-03-07 ENCOUNTER — Other Ambulatory Visit: Payer: Self-pay

## 2021-03-07 ENCOUNTER — Ambulatory Visit (INDEPENDENT_AMBULATORY_CARE_PROVIDER_SITE_OTHER): Payer: 59

## 2021-03-07 ENCOUNTER — Ambulatory Visit (INDEPENDENT_AMBULATORY_CARE_PROVIDER_SITE_OTHER): Payer: 59 | Admitting: Family Medicine

## 2021-03-07 VITALS — BP 100/68 | HR 74 | Ht 62.0 in | Wt 110.0 lb

## 2021-03-07 DIAGNOSIS — M79672 Pain in left foot: Secondary | ICD-10-CM | POA: Diagnosis not present

## 2021-03-07 NOTE — Patient Instructions (Addendum)
Good to see you today.  Scaphoid pad.  [employer]  Arch strap  I recommend you obtained a compression sleeve to help with your joint problems. There are many options on the market however I recommend obtaining a full ankle Body Helix compression sleeve.  You can find information (including how to appropriate measure yourself for sizing) can be found at www.Body http://www.lambert.com/.  Many of these products are health savings account (HSA) eligible.   You can use the compression sleeve at any time throughout the day but is most important to use while being active as well as for 2 hours post-activity.   It is appropriate to ice following activity with the compression sleeve in place.   Please get an Xray today before you leave.  Follow-up: one month

## 2021-03-10 NOTE — Progress Notes (Signed)
Left heel x-ray looks normal to radiology

## 2021-03-13 NOTE — Progress Notes (Signed)
? ?I, Christoper Fabian, LAT, ATC, am serving as scribe for Dr. Clementeen Graham. ? ?Brittany Rogers is a 46 y.o. female who presents to [facility] at [facility] today for f/u of R-sided low back and glute pain w/ radiating pain into her R post thigh and calf due to spinal stenosis and spondylolisthesis per MRI.  She was last seen by Dr. Denyse Amass for this issue on 02/11/21 and noted con't R buttock and leg pain despite several medications prescribed including prednisone, tizanidine and gabapentin.  She was then referred for a lumbar ESI that she had on 02/12/21 (R L5 nerve root block and transforaminal epidural). More recently, she was seen for her L foot/heel pain and was provided a scaphoid pad and advised to purchase an ankle compression sleeve.  Today, pt reports that she con't to get paresthesias in her R leg that are worse in the morning.  She con't to have pain in her R glute.  She has completed 3 PT sessions but will be d/c soon.  She states that she feels like she's ready for a 2nd ESI.  She notes that she has resumed working out and has no pain while working out.  She also reports some L-sided LBP.  She con't to have bruising along her L medial heel but notes that she is able to walk w/ little to no pain.  She does feel the scaphoid pad is helping. ? ?Diagnostic testing: L heel XR- 03/07/21; R hip and L-spine MRI- 02/09/21; L-spine and R hip XR- 02/03/21 ?Pertinent review of systems: No fevers or chills ? ?Relevant historical information: Avid weightlifter ? ? ?Exam:  ?BP 110/70 (BP Location: Left Arm, Patient Position: Sitting, Cuff Size: Normal)   Pulse (!) 43   Ht 5\' 2"  (1.575 m)   Wt 110 lb (49.9 kg)   LMP 02/19/2021   SpO2 98%   BMI 20.12 kg/m?  ?General: Well Developed, well nourished, and in no acute distress.  ? ?MSK: Left foot: ?Bruise present at posterior medial foot just superior to the calcaneus.  Tender palpation. ?Normal foot and ankle motion.  Nontender to foot and ankle motion and  resisted strength testing.   ?Nontender along the course of the posterior tibialis tendon. ? ? ? ? ?Lab and Radiology Results ? ?EXAM: ?LEFT OS CALCIS - 2+ VIEW ?  ?COMPARISON:  None. ?  ?FINDINGS: ?Cortical margins of the calcaneus are intact. There is no evidence ?of fracture or other focal bone lesions. Normal subtalar joint. No ?plantar calcaneal spur or Achilles tendon enthesophyte. Soft tissues ?are unremarkable. No soft tissue gas or radiopaque foreign body. ?  ?IMPRESSION: ?Negative radiographs of the left calcaneus. ?  ?  ?Electronically Signed ?  By: 04/19/2021 M.D. ?  On: 03/09/2021 13:33 ?I, 03/11/2021, personally (independently) visualized and performed the interpretation of the images attached in this note. ? ? ? ?Assessment and Plan: ?46 y.o. female with  ?Left foot pain.  Etiology is unclear.  She is acting like she has a bruise to the heel capsule or the intrinsic medial foot musculature.  However she is doing pretty well with arch support and a little bit of rest.  I am unable to reproduce the pain by activating the thumb foot or ankle muscles so is difficult to tell exactly where her pain is being generated.  Plan for a bit of watchful waiting and conservative management treatment.  If needed certainly could proceed to MRI of the ankle or even  calcaneus. ? ?Radiculopathy.  Improved with epidural steroid injection on February 1.  Her symptoms today are probably not bad enough that a repeat epidural steroid injection makes sense at this time however certainly could do that in the future if needed.  She can let me know and I can order it.  Recommend continue graduated return to weightlifting as tolerated.  Continue to work on core strengthening.  Recheck back as needed. ? ?Total encounter time 30 minutes including face-to-face time with the patient and, reviewing past medical record, and charting on the date of service.   ?Discussed treatment plan and options. ? ? ? ? ?Discussed warning signs  or symptoms. Please see discharge instructions. Patient expresses understanding. ? ? ?The above documentation has been reviewed and is accurate and complete Clementeen Graham, M.D. ? ? ?

## 2021-03-17 ENCOUNTER — Ambulatory Visit (INDEPENDENT_AMBULATORY_CARE_PROVIDER_SITE_OTHER): Payer: 59 | Admitting: Family Medicine

## 2021-03-17 ENCOUNTER — Other Ambulatory Visit: Payer: Self-pay

## 2021-03-17 VITALS — BP 110/70 | HR 43 | Ht 62.0 in | Wt 110.0 lb

## 2021-03-17 DIAGNOSIS — M79672 Pain in left foot: Secondary | ICD-10-CM | POA: Diagnosis not present

## 2021-03-17 DIAGNOSIS — M5431 Sciatica, right side: Secondary | ICD-10-CM | POA: Diagnosis not present

## 2021-03-17 DIAGNOSIS — M4317 Spondylolisthesis, lumbosacral region: Secondary | ICD-10-CM

## 2021-03-17 NOTE — Patient Instructions (Addendum)
Good to see you today. ? ?Let me know if /when you'd like to proceed to an epidural. ? ?Con't to use the scaphoid pad in your shoe insert. ? ?Follow-up: as needed ?

## 2021-03-17 NOTE — Progress Notes (Signed)
Formatting of this note might be different from the original.  Images from the original note were not included.      I, Christoper Fabian, LAT, ATC, am serving as scribe for Dr. Clementeen Graham.    Brandy Roy is a 46 y.o. female who presents to [facility] at [facility] today for f/u of R-sided low back and glute pain w/ radiating pain into her R post thigh and calf due to spinal stenosis and spondylolisthesis per MRI.  She was last seen by Dr. Denyse Amass for this issue on 02/11/21 and noted con't R buttock and leg pain despite several medications prescribed including prednisone, tizanidine and gabapentin.  She was then referred for a lumbar ESI that she had on 02/12/21 (R L5 nerve root block and transforaminal epidural). More recently, she was seen for her L foot/heel pain and was provided a scaphoid pad and advised to purchase an ankle compression sleeve.  Today, pt reports that she con't to get paresthesias in her R leg that are worse in the morning.  She con't to have pain in her R glute.  She has completed 3 PT sessions but will be d/c soon.  She states that she feels like she's ready for a 2nd ESI.  She notes that she has resumed working out and has no pain while working out.  She also reports some L-sided LBP.  She con't to have bruising along her L medial heel but notes that she is able to walk w/ little to no pain.  She does feel the scaphoid pad is helping.    Diagnostic testing: L heel XR- 03/07/21; R hip and L-spine MRI- 02/09/21; L-spine and R hip XR- 02/03/21  Pertinent review of systems: No fevers or chills    Relevant historical information: Avid weightlifter    Exam:   BP 110/70 (BP Location: Left Arm, Patient Position: Sitting, Cuff Size: Normal)   Pulse (!) 43   Ht 5' 2 (1.575 m)   Wt 110 lb (49.9 kg)   LMP 02/19/2021   SpO2 98%   BMI 20.12 kg/m   General: Well Developed, well nourished, and in no acute distress.     MSK: Left foot:  Bruise present at posterior medial foot just superior to the  calcaneus.  Tender palpation.  Normal foot and ankle motion.  Nontender to foot and ankle motion and resisted strength testing.    Nontender along the course of the posterior tibialis tendon.    Lab and Radiology Results    EXAM:  LEFT OS CALCIS - 2+ VIEW    COMPARISON:  None.    FINDINGS:  Cortical margins of the calcaneus are intact. There is no evidence  of fracture or other focal bone lesions. Normal subtalar joint. No  plantar calcaneal spur or Achilles tendon enthesophyte. Soft tissues  are unremarkable. No soft tissue gas or radiopaque foreign body.    IMPRESSION:  Negative radiographs of the left calcaneus.      Electronically Signed    By: Narda Rutherford M.D.    On: 03/09/2021 13:33  I, Clementeen Graham, personally (independently) visualized and performed the interpretation of the images attached in this note.    Assessment and Plan:  46 y.o. female with   Left foot pain.  Etiology is unclear.  She is acting like she has a bruise to the heel capsule or the intrinsic medial foot musculature.  However she is doing pretty well with arch support and a little bit of  rest.  I am unable to reproduce the pain by activating the thumb foot or ankle muscles so is difficult to tell exactly where her pain is being generated.  Plan for a bit of watchful waiting and conservative management treatment.  If needed certainly could proceed to MRI of the ankle or even calcaneus.    Radiculopathy.  Improved with epidural steroid injection on February 1.  Her symptoms today are probably not bad enough that a repeat epidural steroid injection makes sense at this time however certainly could do that in the future if needed.  She can let me know and I can order it.  Recommend continue graduated return to weightlifting as tolerated.  Continue to work on core strengthening.  Recheck back as needed.    Total encounter time 30 minutes including face-to-face time with the patient and, reviewing past medical record, and charting on the date  of service.    Discussed treatment plan and options.    Discussed warning signs or symptoms. Please see discharge instructions. Patient expresses understanding.    The above documentation has been reviewed and is accurate and complete Clementeen Graham, M.D.      Electronically signed by Rodolph Bong, MD at 03/17/2021 10:03 AM EST

## 2021-03-20 ENCOUNTER — Encounter: Payer: 59 | Admitting: Physical Therapy

## 2021-04-04 ENCOUNTER — Ambulatory Visit: Payer: 59 | Admitting: Family Medicine

## 2021-04-16 ENCOUNTER — Encounter: Payer: Self-pay | Admitting: Family Medicine

## 2021-04-16 DIAGNOSIS — M5431 Sciatica, right side: Secondary | ICD-10-CM

## 2021-04-16 DIAGNOSIS — R42 Dizziness and giddiness: Secondary | ICD-10-CM

## 2021-04-16 DIAGNOSIS — M4317 Spondylolisthesis, lumbosacral region: Secondary | ICD-10-CM

## 2021-04-17 NOTE — Addendum Note (Signed)
Addended by: Rodolph Bong on: 04/17/2021 12:31 PM ? ? Modules accepted: Orders ? ?

## 2021-04-21 ENCOUNTER — Encounter: Payer: Self-pay | Admitting: Family Medicine

## 2021-04-24 ENCOUNTER — Other Ambulatory Visit: Payer: 59

## 2021-05-08 ENCOUNTER — Encounter: Payer: Self-pay | Admitting: Family Medicine

## 2021-05-09 MED ORDER — MELOXICAM 15 MG PO TABS: 15.0000 mg | ORAL_TABLET | Freq: Every day | ORAL | 0 refills | Status: DC

## 2021-06-17 ENCOUNTER — Encounter: Payer: Self-pay | Admitting: Family Medicine

## 2021-06-17 MED ORDER — MELOXICAM 15 MG PO TABS: 15.0000 mg | ORAL_TABLET | Freq: Every day | ORAL | 0 refills | Status: DC

## 2022-03-07 NOTE — Progress Notes (Signed)
 Pt is here for sore throat. Onset yesterday.

## 2022-03-07 NOTE — Progress Notes (Signed)
 Subjective  Patient is a 47 y.o. female here c/w sore throat and rash on finger.  Sore throat x last night, rash on finger x 6 weeks. She reports her kids all have strep at home.  She denies any exposures to COVID/flu.  No advil or tylenol  today.  She also notes rash on dorsum distal phalanx digist 3/4 of R hand.  Admits itchiness, some TTP.  No discharge.  She has tried antifungal cream w/o relief, used clobetasol once w/o relief.  She is concerned about bacterial infection.  Review of Systems  HENT:  Positive for congestion, postnasal drip and sore throat. Negative for drooling, ear discharge, ear pain, rhinorrhea and trouble swallowing.   Respiratory:  Positive for cough. Negative for shortness of breath and stridor.   Gastrointestinal:  Negative for abdominal pain, diarrhea and vomiting.  Musculoskeletal:  Negative for neck pain.  Neurological:  Negative for headaches.       Current Problem List: There is no problem list on file for this patient.   Current Medications on file: Current Outpatient Medications on File Prior to Visit  Medication Sig Dispense Refill  . Emgality 120 MG/ML injection     . Levoxyl 25 MCG tablet TAKE 1 TABLET BY MOUTH EVERY MORNING ON EMPTY STOMACH WITH YOUR COMPOUNDED T3/T4.    . rizatriptan (Maxalt) 10 MG tablet Take by mouth.    . SUMAtriptan (Imitrex) 6 MG/0.5ML injection TAKE 6MG  [place] TIMES 1 MAY REPEAT TIMES 1 AFTER 1 HOUR IF HEADACHE RECURS MAXIMUM 2 DOSES/24 HOUR    . topiramate  (Topamax ) 100 MG tablet Take 100 mg by mouth.    . topiramate  50 MG tablet Take 50 mg by mouth.     No current facility-administered medications on file prior to visit.    Past Medical History: Past Medical History:  Diagnosis Date  . Migraine   . Migraine      Past Surgical History: History reviewed. No pertinent surgical history.  Social History: Social History   Socioeconomic History  . Marital status: Married    Spouse name: Not on file  . Number of  children: Not on file  . Years of education: Not on file  . Highest education level: Not on file  Occupational History  . Not on file  Tobacco Use  . Smoking status: Never  . Smokeless tobacco: Never  Substance and Sexual Activity  . Alcohol use: Defer  . Drug use: Not on file  . Sexual activity: Not on file  Other Topics Concern  . Not on file  Social History Narrative  . Not on file    Allergies: Allergies  Allergen Reactions  . Latex     Objective  Visit Vitals BP 114/69 (BP Location: Left arm, Patient Position: Sitting, BP Cuff Size: Adult)  Temp 36.8 C (98.2 F) (Oral)  Resp 18  Ht 5' 1  Wt 50.8 kg  LMP  (LMP Unknown)  BMI 21.16 kg/m  OB Status Having periods  Smoking Status Never  BSA 1.48 m     Physical Exam Vitals and nursing note reviewed.  Constitutional:      General: She is not in acute distress.    Appearance: Normal appearance. She is not ill-appearing or toxic-appearing.  HENT:     Head: Normocephalic and atraumatic.     Comments: Injected nasal passages    Right Ear: Tympanic membrane and ear canal normal. No swelling or tenderness. Tympanic membrane is not injected, erythematous, retracted or bulging.  Left Ear: Tympanic membrane and ear canal normal. No swelling or tenderness. Tympanic membrane is not injected, erythematous, retracted or bulging.     Nose: Congestion and rhinorrhea present. No mucosal edema. Rhinorrhea is clear.     Right Turbinates: Not enlarged.     Left Turbinates: Not enlarged.     Right Sinus: No maxillary sinus tenderness or frontal sinus tenderness.     Left Sinus: No maxillary sinus tenderness or frontal sinus tenderness.     Mouth/Throat:     Mouth: Mucous membranes are moist.     Pharynx: Oropharynx is clear. Uvula midline. No pharyngeal swelling, posterior oropharyngeal erythema or uvula swelling.     Tonsils: No tonsillar exudate or tonsillar abscesses. 0 on the right. 0 on the left.  Eyes:     General: No  scleral icterus.    Extraocular Movements: Extraocular movements intact.     Conjunctiva/sclera: Conjunctivae normal.  Cardiovascular:     Rate and Rhythm: Normal rate and regular rhythm.     Heart sounds: No murmur heard. Pulmonary:     Effort: Pulmonary effort is normal. No respiratory distress.     Breath sounds: Normal breath sounds. No wheezing, rhonchi or rales.  Musculoskeletal:        General: Normal range of motion.     Cervical back: Normal range of motion and neck supple. No rigidity.     Comments: Slightly erythematous dorsum distal phalanx R hand over 3rd/4th digits, clear vesicles noted.  Ring finger slightly tender, no warmth, no discharge.  Lymphadenopathy:     Cervical: No cervical adenopathy.  Skin:    General: Skin is warm.  Neurological:     General: No focal deficit present.     Mental Status: She is alert and oriented to person, place, and time.     Motor: No weakness.     Gait: Gait normal.  Psychiatric:        Mood and Affect: Mood normal.        Behavior: Behavior normal.      Results: No results found for this visit on 03/07/22.     Assessment  Tritia was seen today for sore throat. Diagnoses and all orders for this visit: Viral upper respiratory tract infection (Primary) -     Beta Strep GP A Culture Dyshidrotic eczema Exposure to strep throat -     Beta Strep GP A Culture   Continue clobetasol as discussed Keep fingers dry, use lotion frequently  We will call with lab results, typically within 2 - 5 days, if you do not have results within 1 week, feel free to contact the clinic.  Results will also be available via MyChart.  Recommend take COVID test at home     MDM:     1 Acute complicated illness or injury     Unique ordered tests: One     Risk:: Moderate

## 2022-03-11 ENCOUNTER — Inpatient Hospital Stay: Admit: 2022-03-11 | Discharge: 2022-03-24 | Payer: PRIVATE HEALTH INSURANCE

## 2022-03-11 DIAGNOSIS — M79641 Pain in right hand: Secondary | ICD-10-CM

## 2022-05-26 ENCOUNTER — Ambulatory Visit: Payer: 59 | Admitting: Family Medicine

## 2023-03-01 LAB — COLOGUARD: COLOGUARD: NEGATIVE

## 2023-05-23 NOTE — Progress Notes (Addendum)
 Subjective Patient ID: Brittany Rogers is a 48 y.o. female.  Patient here for evaluation of possible UTI.  Patient reports urinary frequency, urgency, and dysuria.  She states it started 2 weeks ago and took some AZO and drank more water and symptoms resolved.  Symptoms started again today.  Patient denies fever, back pain, vomiting, or vaginal discharge.  She reports history of UTI's, reports similar symptoms in the past.    History provided by:  Patient Language interpreter used: No   Difficulty Urinating Pain quality:  Burning Pain severity:  Moderate Progression:  Gradually worsening Chronicity:  New Associated symptoms: nausea   Associated symptoms: no abdominal pain, no fever, no flank pain, no vaginal discharge and no vomiting   Risk factors: sexually active   Risk factors: no hx of pyelonephritis     Review of Systems  Constitutional:  Negative for chills, diaphoresis and fever.  Respiratory:  Negative for cough, shortness of breath and wheezing.   Cardiovascular:  Negative for chest pain.  Gastrointestinal:  Positive for nausea. Negative for abdominal pain, diarrhea and vomiting.  Genitourinary:  Positive for dysuria, frequency and urgency. Negative for flank pain, hematuria, pelvic pain, vaginal bleeding, vaginal discharge and vaginal pain.  Musculoskeletal:  Negative for back pain.  Skin:  Negative for rash.  Neurological:  Negative for headaches.    Patient History  Allergies: Allergies  Allergen Reactions  . Latex      Past Medical History:  Diagnosis Date  . Migraine   . Migraine    History reviewed. No pertinent surgical history. Social History   Socioeconomic History  . Marital status: Married    Spouse name: Not on file  . Number of children: Not on file  . Years of education: Not on file  . Highest education level: Not on file  Occupational History  . Not on file  Tobacco Use  . Smoking status: Never  . Smokeless tobacco: Never  Substance  and Sexual Activity  . Alcohol use: Not Currently  . Drug use: Not on file  . Sexual activity: Not on file  Other Topics Concern  . Not on file  Social History Narrative  . Not on file   History reviewed. No pertinent family history. Current Outpatient Medications on File Prior to Visit  Medication Sig Dispense Refill  . Emgality 120 MG/ML injection     . Levoxyl 25 MCG tablet TAKE 1 TABLET BY MOUTH EVERY MORNING ON EMPTY STOMACH WITH YOUR COMPOUNDED T3/T4.    . rizatriptan (Maxalt) 10 MG tablet Take by mouth.    . SUMAtriptan (Imitrex) 6 MG/0.5ML injection TAKE 6MG  Howard TIMES 1 MAY REPEAT TIMES 1 AFTER 1 HOUR IF HEADACHE RECURS MAXIMUM 2 DOSES/24 HOUR    . topiramate  (Topamax ) 100 MG tablet Take 100 mg by mouth.    . topiramate  50 MG tablet Take 50 mg by mouth.     No current facility-administered medications on file prior to visit.     Objective  Vitals:   05/23/23 1347  BP: 113/67  Pulse: (!) 59  Resp: 18  Temp: 36.6 C (97.8 F)  TempSrc: Oral  SpO2: 98%  Weight: 49.9 kg  Height: 5' 2        OBGYN/Pregnancy Status: Having periods       No results found.  Physical Exam Vitals and nursing note reviewed.  Constitutional:      General: She is not in acute distress.    Appearance: Normal appearance. She is not  ill-appearing, toxic-appearing or diaphoretic.  HENT:     Head: Normocephalic and atraumatic.  Eyes:     Conjunctiva/sclera: Conjunctivae normal.     Pupils: Pupils are equal, round, and reactive to light.  Cardiovascular:     Rate and Rhythm: Normal rate and regular rhythm.     Pulses: Normal pulses.  Pulmonary:     Effort: Pulmonary effort is normal. No respiratory distress.     Breath sounds: No wheezing.  Abdominal:     Tenderness: There is no right CVA tenderness or left CVA tenderness.  Skin:    General: Skin is warm.  Neurological:     Mental Status: She is alert.      Results for orders placed or performed in visit on 05/23/23  POCT  urinalysis dipstick manually resulted  Component Result   Color, UA Yellow   Clarity, UA Clear   Glucose, UA Negative   Bilirubin, UA Negative   Ketones, UA Negative   Spec Grav, UA <=1.005 (A)   Blood, UA Negative   pH, UA 6.5   Protein, UA Negative   Urobilinogen, UA Negative   Leukocytes, UA Negative   Nitrite, UA Negative     Procedures MDM:     1 Acute, uncomplicated illness or injury     Explanation of Medical Decision Making and variances from expected care:  Patient stable, no acute distress.  Vitals normal.  No CVA tenderness.  Patient treated for UTI based on symptoms.  Urine diluted.  Urine culture sent to lab    Unique ordered tests: One     Risk:: Moderate          Assessment/Plan Diagnoses and all orders for this visit:  Dysuria -     Urine Culture, Routine -     nitrofurantoin, macrocrystal-monohydrate, (Macrobid) 100 MG capsule; Take 1 capsule (100 mg total) by mouth in the morning and 1 capsule (100 mg total) in the evening. Do all this for 7 days.  Urinary tract infection without hematuria, site unspecified -     nitrofurantoin, macrocrystal-monohydrate, (Macrobid) 100 MG capsule; Take 1 capsule (100 mg total) by mouth in the morning and 1 capsule (100 mg total) in the evening. Do all this for 7 days.  Other orders -     POCT urinalysis dipstick manually resulted      Disposition Status: Home  Progress note signed by Eva Rase, PA on 05/23/23 at  2:46 PM

## 2023-05-23 NOTE — Progress Notes (Signed)
 Frequent urination with some burning sensation since this morning.

## 2023-05-30 IMAGING — DX DG OS CALCIS 2+V*L*
2 series · 2 of 2 positions shown · non-contrast
Comparison: None.

CLINICAL DATA: Left medial/hindfoot pain with bruising 4 days ago.
Left heel pain.

EXAM:
LEFT OS CALCIS - 2+ VIEW

[calcaneus axial]
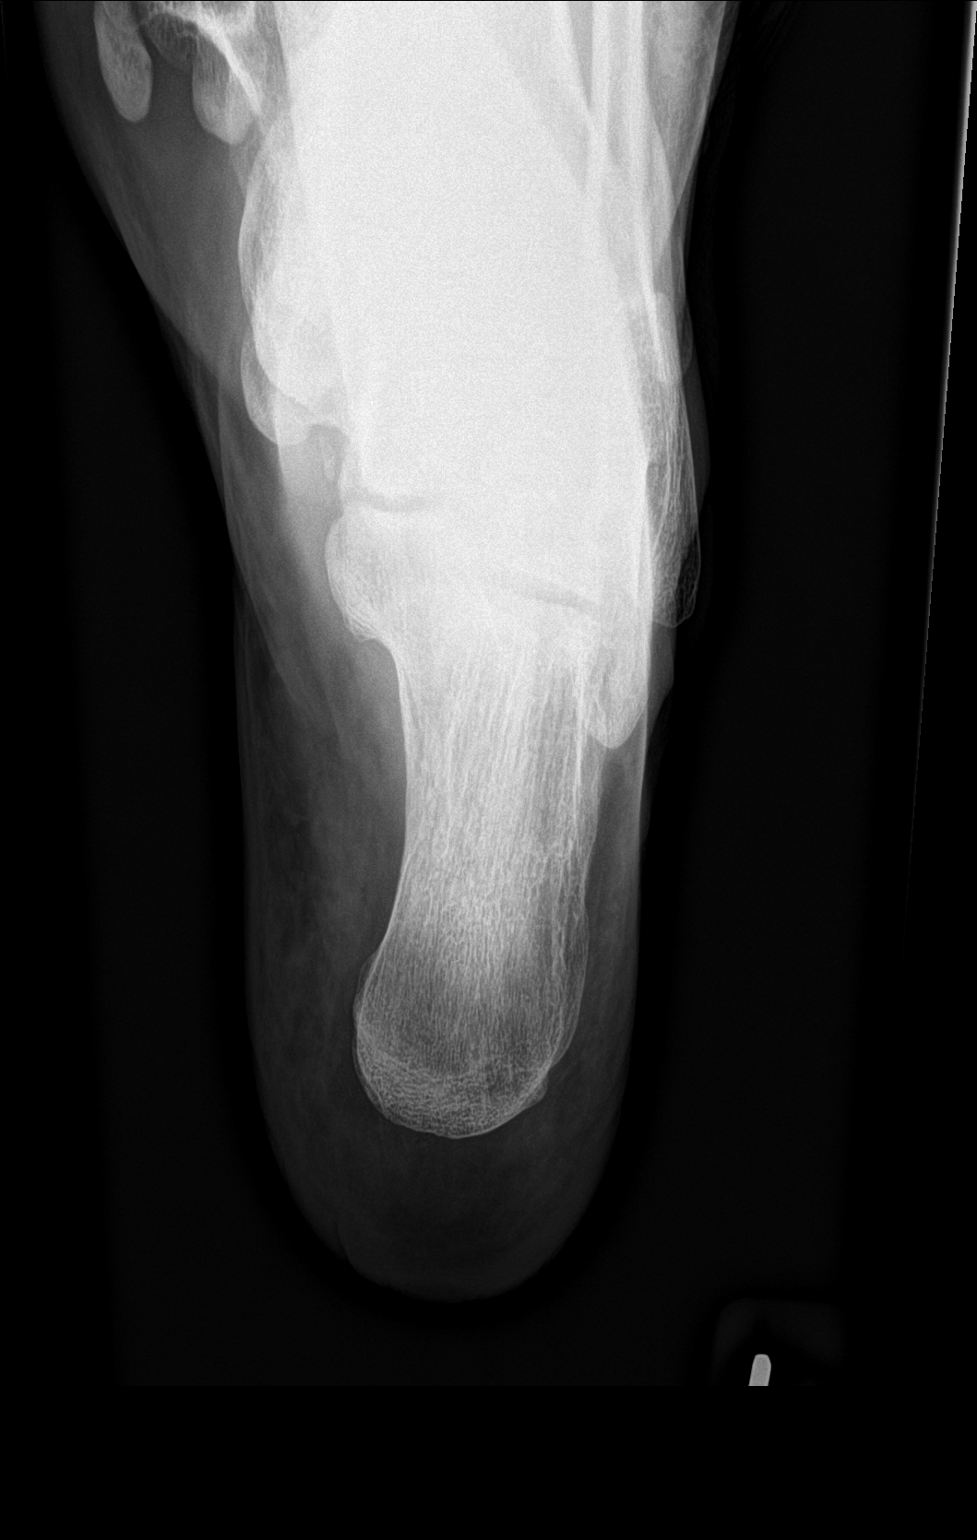

[calcaneus lat]
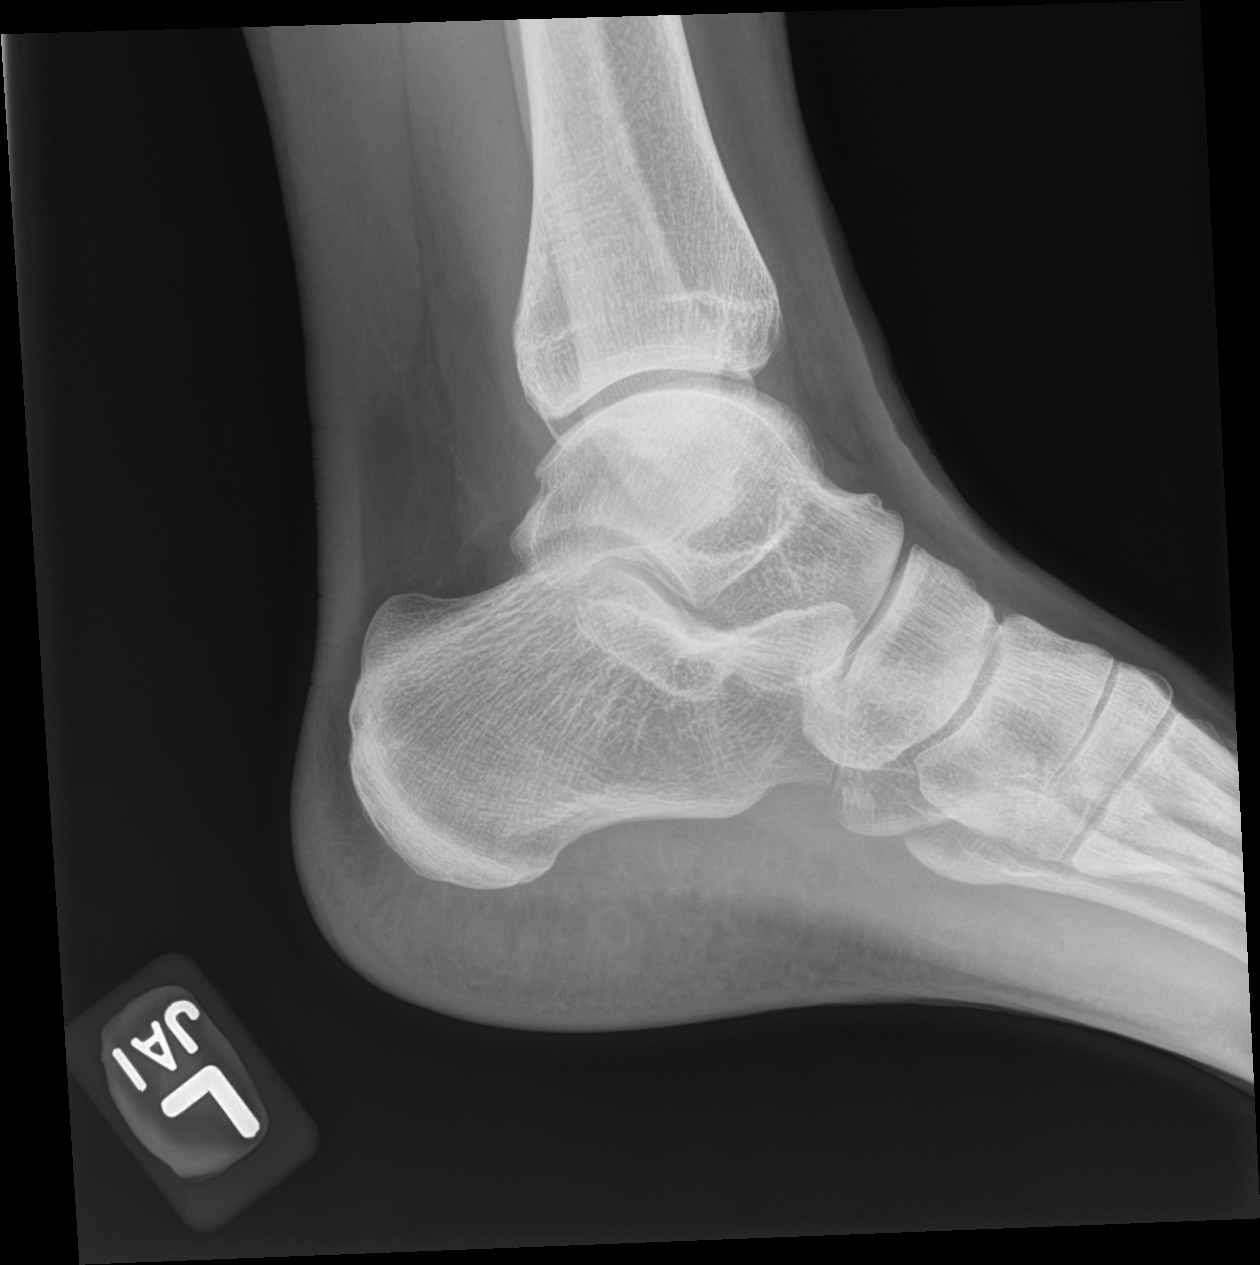

[2 of 2 positions shown; findings below may reference images not displayed]

FINDINGS: Cortical margins of the calcaneus are intact. There is no evidence
of fracture or other focal bone lesions. Normal subtalar joint. No
plantar calcaneal spur or Achilles tendon enthesophyte. Soft tissues
are unremarkable. No soft tissue gas or radiopaque foreign body.
IMPRESSION: Negative radiographs of the left calcaneus.

## 2023-09-12 ENCOUNTER — Other Ambulatory Visit: Payer: Self-pay

## 2023-09-12 ENCOUNTER — Emergency Department (HOSPITAL_COMMUNITY)

## 2023-09-12 ENCOUNTER — Observation Stay (HOSPITAL_COMMUNITY)

## 2023-09-12 ENCOUNTER — Encounter (HOSPITAL_COMMUNITY): Payer: Self-pay

## 2023-09-12 ENCOUNTER — Observation Stay (HOSPITAL_COMMUNITY)
Admission: EM | Admit: 2023-09-12 | Discharge: 2023-09-13 | Disposition: A | Discharge status: Home / Self Care | Attending: Internal Medicine | Admitting: Internal Medicine

## 2023-09-12 DIAGNOSIS — R4182 Altered mental status, unspecified: Secondary | ICD-10-CM | POA: Insufficient documentation

## 2023-09-12 DIAGNOSIS — J309 Allergic rhinitis, unspecified: Secondary | ICD-10-CM | POA: Insufficient documentation

## 2023-09-12 DIAGNOSIS — R569 Unspecified convulsions: Secondary | ICD-10-CM | POA: Diagnosis present

## 2023-09-12 DIAGNOSIS — G43909 Migraine, unspecified, not intractable, without status migrainosus: Secondary | ICD-10-CM

## 2023-09-12 DIAGNOSIS — G43809 Other migraine, not intractable, without status migrainosus: Secondary | ICD-10-CM

## 2023-09-12 DIAGNOSIS — R519 Headache, unspecified: Secondary | ICD-10-CM | POA: Insufficient documentation

## 2023-09-12 HISTORY — DX: Migraine, unspecified, not intractable, without status migrainosus: G43.909

## 2023-09-12 LAB — CBC WITH DIFFERENTIAL/PLATELET
Abs Immature Granulocytes: 0.02 K/uL (ref 0.00–0.07)
Basophils Absolute: 0 K/uL (ref 0.0–0.1)
Basophils Relative: 1 %
Eosinophils Absolute: 0.1 K/uL (ref 0.0–0.5)
Eosinophils Relative: 3 %
HCT: 44.2 % (ref 36.0–46.0)
Hemoglobin: 14.5 g/dL (ref 12.0–15.0)
Immature Granulocytes: 1 %
Lymphocytes Relative: 24 %
Lymphs Abs: 0.8 K/uL (ref 0.7–4.0)
MCH: 31 pg (ref 26.0–34.0)
MCHC: 32.8 g/dL (ref 30.0–36.0)
MCV: 94.6 fL (ref 80.0–100.0)
Monocytes Absolute: 0.2 K/uL (ref 0.1–1.0)
Monocytes Relative: 7 %
Neutro Abs: 2.1 K/uL (ref 1.7–7.7)
Neutrophils Relative %: 64 %
Platelets: 169 K/uL (ref 150–400)
RBC: 4.67 MIL/uL (ref 3.87–5.11)
RDW: 12.4 % (ref 11.5–15.5)
WBC: 3.3 K/uL — ABNORMAL LOW (ref 4.0–10.5)
nRBC: 0 % (ref 0.0–0.2)

## 2023-09-12 LAB — COMPREHENSIVE METABOLIC PANEL WITH GFR
ALT: 36 U/L (ref 0–44)
AST: 33 U/L (ref 15–41)
Albumin: 3.7 g/dL (ref 3.5–5.0)
Alkaline Phosphatase: 42 U/L (ref 38–126)
Anion gap: 13 (ref 5–15)
BUN: 17 mg/dL (ref 6–20)
CO2: 19 mmol/L — ABNORMAL LOW (ref 22–32)
Calcium: 8.6 mg/dL — ABNORMAL LOW (ref 8.9–10.3)
Chloride: 106 mmol/L (ref 98–111)
Creatinine, Ser: 1.13 mg/dL — ABNORMAL HIGH (ref 0.44–1.00)
GFR, Estimated: >60 mL/min (ref >60)
Glucose, Bld: 235 mg/dL — ABNORMAL HIGH (ref 70–99)
Potassium: 3.9 mmol/L (ref 3.5–5.1)
Sodium: 138 mmol/L (ref 135–145)
Total Bilirubin: 0.3 mg/dL (ref 0.0–1.2)
Total Protein: 6.3 g/dL — ABNORMAL LOW (ref 6.5–8.1)

## 2023-09-12 LAB — I-STAT CHEM 8, ED
BUN: 20 mg/dL (ref 6–20)
Calcium, Ion: 1.15 mmol/L (ref 1.15–1.40)
Chloride: 108 mmol/L (ref 98–111)
Creatinine, Ser: 1 mg/dL (ref 0.44–1.00)
Glucose, Bld: 241 mg/dL — ABNORMAL HIGH (ref 70–99)
HCT: 44 % (ref 36.0–46.0)
Hemoglobin: 15 g/dL (ref 12.0–15.0)
Potassium: 4.1 mmol/L (ref 3.5–5.1)
Sodium: 140 mmol/L (ref 135–145)
TCO2: 17 mmol/L — ABNORMAL LOW (ref 22–32)

## 2023-09-12 LAB — RAPID URINE DRUG SCREEN, HOSP PERFORMED
Amphetamines: NOT DETECTED
Barbiturates: NOT DETECTED
Benzodiazepines: POSITIVE — AB
Cocaine: NOT DETECTED
Opiates: NOT DETECTED
Tetrahydrocannabinol: NOT DETECTED

## 2023-09-12 LAB — ETHANOL: Alcohol, Ethyl (B): <15 mg/dL (ref <15)

## 2023-09-12 LAB — CK: Total CK: 152 U/L (ref 38–234)

## 2023-09-12 LAB — TSH: TSH: 0.788 u[IU]/mL (ref 0.350–4.500)

## 2023-09-12 LAB — URINALYSIS, ROUTINE W REFLEX MICROSCOPIC
Bilirubin Urine: NEGATIVE
Glucose, UA: 150 mg/dL — AB
Hgb urine dipstick: NEGATIVE
Ketones, ur: NEGATIVE mg/dL
Leukocytes,Ua: NEGATIVE
Nitrite: NEGATIVE
Protein, ur: NEGATIVE mg/dL
Specific Gravity, Urine: 1.015 (ref 1.005–1.030)
pH: 8 (ref 5.0–8.0)

## 2023-09-12 LAB — MAGNESIUM: Magnesium: 1.9 mg/dL (ref 1.7–2.4)

## 2023-09-12 LAB — T4, FREE: Free T4: 0.66 ng/dL (ref 0.61–1.12)

## 2023-09-12 LAB — CBG MONITORING, ED: Glucose-Capillary: 229 mg/dL — ABNORMAL HIGH (ref 70–99)

## 2023-09-12 LAB — SALICYLATE LEVEL: Salicylate Lvl: <7 mg/dL — ABNORMAL LOW (ref 7.0–30.0)

## 2023-09-12 LAB — AMMONIA: Ammonia: 51 umol/L — ABNORMAL HIGH (ref 9–35)

## 2023-09-12 LAB — ACETAMINOPHEN LEVEL: Acetaminophen (Tylenol), Serum: <10 ug/mL — ABNORMAL LOW (ref 10–30)

## 2023-09-12 MED ORDER — ONDANSETRON HCL 4 MG PO TABS: 4.0000 mg | ORAL_TABLET | Freq: Four times a day (QID) | ORAL | Status: DC | PRN

## 2023-09-12 MED ORDER — ONDANSETRON HCL 4 MG/2ML IJ SOLN
4.0000 mg | Freq: Once | INTRAMUSCULAR | Status: AC
  Administered 2023-09-12: 4 mg via INTRAVENOUS
  Filled 2023-09-12: qty 2

## 2023-09-12 MED ORDER — LEVETIRACETAM 250 MG PO TABS
500.0000 mg | ORAL_TABLET | Freq: Two times a day (BID) | ORAL | Status: DC
  Filled 2023-09-12 (×2): qty 2

## 2023-09-12 MED ORDER — KETOROLAC TROMETHAMINE 30 MG/ML IJ SOLN
15.0000 mg | Freq: Once | INTRAMUSCULAR | Status: AC
  Administered 2023-09-12: 15 mg via INTRAVENOUS
  Filled 2023-09-12: qty 1

## 2023-09-12 MED ORDER — SODIUM CHLORIDE 0.9 % IV BOLUS
1000.0000 mL | Freq: Once | INTRAVENOUS | Status: AC
  Administered 2023-09-12: 1000 mL via INTRAVENOUS

## 2023-09-12 MED ORDER — LORAZEPAM 2 MG/ML IJ SOLN: 2.0000 mg | INTRAMUSCULAR | Status: DC | PRN

## 2023-09-12 MED ORDER — DEXAMETHASONE SODIUM PHOSPHATE 10 MG/ML IJ SOLN
10.0000 mg | Freq: Once | INTRAMUSCULAR | Status: AC
  Administered 2023-09-12: 10 mg via INTRAVENOUS
  Filled 2023-09-12: qty 1

## 2023-09-12 MED ORDER — KETOROLAC TROMETHAMINE 15 MG/ML IJ SOLN
15.0000 mg | Freq: Once | INTRAMUSCULAR | Status: AC
  Administered 2023-09-12: 15 mg via INTRAVENOUS
  Filled 2023-09-12: qty 1

## 2023-09-12 MED ORDER — ACETAMINOPHEN 325 MG PO TABS: 650.0000 mg | ORAL_TABLET | Freq: Four times a day (QID) | ORAL | Status: DC | PRN

## 2023-09-12 MED ORDER — METOCLOPRAMIDE HCL 5 MG/ML IJ SOLN
10.0000 mg | Freq: Once | INTRAMUSCULAR | Status: AC
  Administered 2023-09-12: 10 mg via INTRAVENOUS
  Filled 2023-09-12: qty 2

## 2023-09-12 MED ORDER — ONDANSETRON HCL 4 MG/2ML IJ SOLN: 4.0000 mg | Freq: Four times a day (QID) | INTRAMUSCULAR | Status: DC | PRN

## 2023-09-12 MED ORDER — POLYETHYLENE GLYCOL 3350 17 G PO PACK: 17.0000 g | PACK | Freq: Every day | ORAL | Status: DC | PRN

## 2023-09-12 MED ORDER — SODIUM CHLORIDE 0.9% FLUSH
3.0000 mL | Freq: Two times a day (BID) | INTRAVENOUS | Status: DC
  Administered 2023-09-12 – 2023-09-13 (×2): 3 mL via INTRAVENOUS

## 2023-09-12 MED ORDER — ENOXAPARIN SODIUM 40 MG/0.4ML IJ SOSY
40.0000 mg | PREFILLED_SYRINGE | INTRAMUSCULAR | Status: DC
Start: 2023-09-13 — End: 2023-09-13
  Filled 2023-09-12: qty 0.4

## 2023-09-12 MED ORDER — LEVETIRACETAM (KEPPRA) 500 MG/5 ML ADULT IV PUSH
1500.0000 mg | Freq: Once | INTRAVENOUS | Status: AC
  Administered 2023-09-12: 1500 mg via INTRAVENOUS
  Filled 2023-09-12: qty 15

## 2023-09-12 MED ORDER — GADOBUTROL 1 MMOL/ML IV SOLN
5.0000 mL | Freq: Once | INTRAVENOUS | Status: AC | PRN
  Administered 2023-09-12: 5 mL via INTRAVENOUS

## 2023-09-12 MED ORDER — PROCHLORPERAZINE EDISYLATE 10 MG/2ML IJ SOLN
10.0000 mg | Freq: Once | INTRAMUSCULAR | Status: AC
  Administered 2023-09-12: 10 mg via INTRAVENOUS
  Filled 2023-09-12: qty 2

## 2023-09-12 MED ORDER — ACETAMINOPHEN 650 MG RE SUPP: 650.0000 mg | Freq: Four times a day (QID) | RECTAL | Status: DC | PRN

## 2023-09-12 MED ORDER — FLUTICASONE PROPIONATE 50 MCG/ACT NA SUSP
2.0000 | Freq: Every day | NASAL | Status: DC
  Administered 2023-09-12 – 2023-09-13 (×2): 2 via NASAL
  Filled 2023-09-12: qty 16

## 2023-09-12 NOTE — ED Notes (Signed)
 Patient transported to MRI

## 2023-09-12 NOTE — Consult Note (Signed)
 NEUROLOGY CONSULT NOTE   Date of service: September 12, 2023 Patient Name: Brittany Rogers MRN:  [id number] DOB:  08-Oct-1975 Chief Complaint: seizure Requesting Provider: Pamella Ozell LABOR, DO  History of Present Illness  Brittany Rogers is a 48 y.o. female with long-standing history of migraines, brain biopsy 2010 (benign), who presented to [facility] due to migraine and witnessed seizure episode with decreased responsiveness after.   On EMS arrival, she was noted to be post-ictal. Patient had a witnessed seizure after being moved to ED truck, and was not verbally responsive while en route, received Versed. CBG was 41 with EMS and she was given 25g Dextrose.   On ED arrival, patient began vomiting, with thick emesis. CXR showed possible aspiration penumonitis. Patient appeared post-ictal on EDP assessment, noted to be withdrawing to noxious stimuli. Neurology consulted for seizures.   On neurology exam, patient is drowsy but alert and oriented. She is moving all extremities with no focal ro sensory deficits. She endorses a continued, throbbing headache and nausea (which she said is common with her migraines). She does not remember the episodes this morning.   Husband at bedside endorses that she woke up with a migraine this morning, which is not unusual for her. She went out last night with her friends and drank, had a liquid IV this morning. Progressively, she become disoriented and slower and slower to respond. She was sitting in her chair and then became unresponsive, with right hand/arm tensing up in a contracting fashion. Husband states that she was unable to speak but was tracking him with her eyes during this event. He said it did not last long, wasn't sure on how long.  Husband also described an episode in 2018 where patient has recently had the emgality shots for migraines, then did a high-intensity workout and was at a gas station afterwards, then had a syncopal episode. She was taken to the ED,  negative imaging and discharged with no medications. This was in [place] . Family moved here in 2021.   Patient has a neurology note from 2014 in chart review: chronic headaches beginning 1994, diagnosed as migraine.w/u at [facility] w/findings of abnormal left hemisphere lesion, biopsied and told it was benign. Had rMRIs with most recent one about 2011 and plans for next about 2016 but patient unsure if she will have it. Treated currently with imitrex which abort the headache nearly all the time. Has tried without success propranolol?, codeine, vitamins, Mg++, Lortab, po Dilaudid. IV meperedine plus phenergan or IV dilaudid has helped in past when ER necessary.   ROS  Comprehensive ROS performed and pertinent positives documented in HPI   Past History   Past Medical History:  Diagnosis Date   Abdominal pain    Acute otitis media    Adrenal insufficiency (Addison's disease) (HCC)    Candida vaginitis    Cellulitis of nasal tip    Conjunctivitis    Cough    Depression    Diastasis recti    GAD (generalized anxiety disorder)    Hypothyroid    Irregular heart rate    Migraine    Migraines    Migraines    Murmur    Nausea    Nipple dermatitis    Otitis externa    Panic attack    Pharyngitis    Pneumonia due to COVID-19 virus    Sciatica    Sinusitis    Spondylisthesis    Umbilical hernia    UTI (urinary tract infection)  Vomiting     History reviewed. No pertinent surgical history.  Family History: History reviewed. No pertinent family history.  Social History  reports that she has never smoked. She has never used smokeless tobacco. She reports current alcohol use. No history on file for drug use.  Allergies  Allergen Reactions   Latex     Medications  No current facility-administered medications for this encounter.  Current Outpatient Medications:    EMGALITY 120 MG/ML SOAJ, Inject 120 mg into the skin every 30 (thirty) days., Disp: , Rfl:    LORazepam   (ATIVAN ) 0.5 MG tablet, Take 0.5-1 mg by mouth every 6 (six) hours as needed for anxiety (seizure-like activity)., Disp: , Rfl:    NONFORMULARY OR COMPOUNDED ITEM, Compounded T3/T4 thyroid hormone therapy. Dosage unknown. Prescribed by Dr Mliss Height with [facility] in [place], Disp: , Rfl:    OVER THE COUNTER MEDICATION, Take 1 tablet by mouth daily. Caffeine + Vitamin supplement (300mg  of caffeine / tablet, vitamins unknown), Disp: , Rfl:    rizatriptan (MAXALT) 10 MG tablet, Take 10 mg by mouth as needed for migraine., Disp: , Rfl:    topiramate  (TOPAMAX ) 100 MG tablet, Take 100 mg by mouth 2 (two) times daily. To be given in addition to topiramate  50mg  for a total of 150mg  per dose (300mg schuyler)., Disp: , Rfl:    topiramate  (TOPAMAX ) 50 MG tablet, Take 50 mg by mouth 2 (two) times daily. To be given in addition to topiramate  100mg  for a total of 150mg  per dose (300mg schuyler)., Disp: , Rfl:   Vitals   Vitals:   09/21/23 1353 09/21/23 1442 2023/09/21 1545 09-21-2023 1600  BP: 110/70  114/69 107/67  Pulse: 97  (!) 105 (!) 102  Resp:   13 19  Temp:      TempSrc:      SpO2:   100% 100%  Weight:  50.8 kg    Height:  5' 1 (1.549 m)      Body mass index is 21.16 kg/m.   Physical Exam   Constitutional: Appears well-developed and well-nourished.  Cardiovascular: Normal rate and regular rhythm.  Respiratory: Effort normal, non-labored breathing.   Neurologic Examination  Neuro: Mental Status: Patient is awake, alert, oriented to person, place, month, year, and situation. Patient is able to give a clear and coherent history. No signs of dysarthria, aphasia or neglect Cranial Nerves: II: Visual Fields are full. Pupils are equal, round, and reactive to light.   III,IV, VI: EOMI without ptosis or diploplia.  V: Facial sensation is symmetric to light touch. VII: Facial movement is symmetric.  VIII: hearing is intact to voice X: Uvula elevates symmetrically XI: Head turn intact,  Shoulder shrug is symmetric. XII: tongue is midline without atrophy or fasciculations.  Motor: Tone is normal. Bulk is normal.  Generalized weakness, but no drift in any extremity. Full ROM in neck, supple.  Sensory: Sensation is symmetric to light touch in the arms and legs. Cerebellar: FNF slow, but intact bilaterally   Labs/Imaging/Neurodiagnostic studies   CBC:  Recent Labs  Lab Sep 21, 2023 1352 September 21, 2023 1356  WBC 3.3*  --   NEUTROABS 2.1  --   HGB 14.5 15.0  HCT 44.2 44.0  MCV 94.6  --   PLT 169  --    Basic Metabolic Panel:  Lab Results  Component Value Date   NA 140 09/21/23   K 4.1 09/21/2023   CO2 19 (L) 2023/09/21   GLUCOSE 241 (H) 21-Sep-2023   BUN 20 09-21-2023  CREATININE 1.00 09/12/2023   CALCIUM 8.6 (L) 09/12/2023   GFRNONAA >60 09/12/2023   Lipid Panel: No results found for: LDLCALC HgbA1c: No results found for: HGBA1C Urine Drug Screen:     Component Value Date/Time   LABOPIA NONE DETECTED 09/12/2023 1447   COCAINSCRNUR NONE DETECTED 09/12/2023 1447   LABBENZ POSITIVE (A) 09/12/2023 1447   AMPHETMU NONE DETECTED 09/12/2023 1447   THCU NONE DETECTED 09/12/2023 1447   LABBARB NONE DETECTED 09/12/2023 1447    Alcohol Level     Component Value Date/Time   ETH <15 09/12/2023 1447   Ammonia: 51  CT Head without contrast(Personally reviewed): No acute intracranial abnormality.    MRI Brain with and without(Personally reviewed): pending  Neurodiagnostics rEEG:  pending  ASSESSMENT   Sevanna Ballengee is a 48 y.o. female with long-standing history of migraines, brain biopsy 2010 (benign), who presented to [facility] due to migraine and witnessed seizure episode with decreased responsiveness after.   Patient does not have seizure history, but does have extensive migraine history and a history of brain lesion with unknown follow-up imaging. Husband/Patient do not know when her last MRI was. She said she has had one since moving to [place], but there are  no records on care everywhere.   We will treat her with a migraine cocktail. If her symptoms persist, can consider 1g Depakote if her pregnancy test is negative.  As patient has had two witnessed episodes and has a history of structural lesion, will start her on maintenance Keppra .  She will need imaging to evaluate for prior brain lesion, biopsied in 2010 and will need venogram to evaluate for CVST given her headache, seizures, and nausea.    RECOMMENDATIONS   - migraine cocktail ordered   (Compazine , toradol , decadron ) - can consider 1g depakote if headache persists and pregnancy test is negative - routine EEG - continue Keppra  500mg  BID - MRI Brain with and without - MRV to evaluate for CVST ___________________________________________________________________    Signed, Rocky JAYSON Likes, NP [facility]    Attending Neurohospitalist Addendum Patient seen and examined with APP/Resident. Agree with the history and physical as documented above. Agree with the plan as documented, which I helped formulate. I have edited the note above to reflect my full findings and recommendations. I have independently reviewed the chart, obtained history, review of systems and examined the patient.I have personally reviewed pertinent head/neck/spine imaging (CT/MRI). Please feel free to call with any questions.  -- Elida Ross, MD [facility] [phone]  If 7pm- 7am, please page neurology on call as listed in AMION.

## 2023-09-12 NOTE — ED Provider Notes (Signed)
 [place] EMERGENCY DEPARTMENT AT [facility] Provider Note  CSN: [id number] Arrival date & time: 09/12/23 1340  Chief Complaint(s) Headache and Seizures  HPI Brittany Rogers is a 48 y.o. female with past medical history as below, significant for migraine headaches, GAD, adrenal insufficiency who presents to the ED with complaint of seizure/ HA  Patient arrives to the ER by EMS, they were initially called to the house secondary to migraine, patient had witnessed seizure per husband while he is on the phone with EMS.  Upon EMS arrival patient was reportedly postictal. She consumed some ETOH last night. While she was being transported from the home to the ambulance she had a witnessed seizure, she was apneic, vomited multiple times.  Family report patient had a similar presentation in the past in 2019 with severe headache at OSH.   EMS reports patient had seizure just prior to arrival   Upon my assessment patient is responding somewhat to noxious stimulus, she is not answering questions.  She appears postictal.  There is no external evidence of trauma.  Level 5 caveat AMS   Past Medical History Past Medical History:  Diagnosis Date   Abdominal pain    Acute otitis media    Adrenal insufficiency (Addison's disease) (HCC)    Candida vaginitis    Cellulitis of nasal tip    Conjunctivitis    Cough    Depression    Diastasis recti    GAD (generalized anxiety disorder)    Hypothyroid    Irregular heart rate    Migraine    Migraines    Migraines    Murmur    Nausea    Nipple dermatitis    Otitis externa    Panic attack    Pharyngitis    Pneumonia due to COVID-19 virus    Sciatica    Sinusitis    Spondylisthesis    Umbilical hernia    UTI (urinary tract infection)    Vomiting    Patient Active Problem List   Diagnosis Date Noted   Migraines    Spondylolisthesis at L5-S1 level 02/03/2021   Home Medication(s) Prior to Admission medications   Medication Sig Start  Date End Date Taking? Authorizing Provider  EMGALITY 120 MG/ML SOAJ Inject into the skin. 12/20/19   [provider]  fluocinonide-emollient (LIDEX-E) 0.05 % cream Apply 1 application topically 2 (two) times daily. Patient not taking: Reported on 12/21/2019 12/08/19   [provider]  meloxicam  (MOBIC ) 15 MG tablet Take 1 tablet (15 mg total) by mouth daily. 06/17/21   Joane Artist RAMAN, MD  NONFORMULARY OR COMPOUNDED ITEM     [provider]  rizatriptan (MAXALT) 10 MG tablet Take by mouth. 12/20/19   [provider]  SUMAtriptan 6 MG/0.5ML SOAJ Inject into the skin.    [provider]  tiZANidine  (ZANAFLEX ) 4 MG tablet Take 1 tablet (4 mg total) by mouth every 8 (eight) hours as needed for muscle spasms. 02/03/21   Joane Artist RAMAN, MD  topiramate  (TOPAMAX ) 100 MG tablet Take 100 mg by mouth 2 (two) times daily. 12/06/19   [provider]  topiramate  (TOPAMAX ) 50 MG tablet Take 50 mg by mouth 2 (two) times daily. 10/04/19   [provider]  Past Surgical History History reviewed. No pertinent surgical history. Family History History reviewed. No pertinent family history.  Social History Social History   Tobacco Use   Smoking status: Never   Smokeless tobacco: Never  Substance Use Topics   Alcohol use: Yes   Allergies Latex  Review of Systems A thorough review of systems was obtained and all systems are negative except as noted in the HPI and PMH.   Physical Exam Vital Signs  I have reviewed the triage vital signs BP 110/70   Pulse 97   Temp 97.6 F (36.4 C) (Axillary)   Resp (!) 25   Ht 5' 1 (1.549 m)   Wt 50.8 kg   SpO2 100%   BMI 21.16 kg/m  Physical Exam Vitals and nursing note reviewed.  Constitutional:      General: She is not in acute distress.    Appearance: Normal appearance. She  is well-developed. She is not ill-appearing.     Comments: Emesis noted in hair and clothes   HENT:     Head: Normocephalic and atraumatic.     Right Ear: External ear normal.     Left Ear: External ear normal.     Nose: Nose normal.     Mouth/Throat:     Mouth: Mucous membranes are moist.  Eyes:     General: No scleral icterus.       Right eye: No discharge.        Left eye: No discharge.     Pupils: Pupils are equal, round, and reactive to light.  Cardiovascular:     Rate and Rhythm: Normal rate.  Pulmonary:     Effort: Pulmonary effort is normal. No respiratory distress.     Breath sounds: No stridor.  Abdominal:     General: Abdomen is flat. There is no distension.     Palpations: Abdomen is soft.     Tenderness: There is no guarding.  Musculoskeletal:        General: No deformity.     Cervical back: No rigidity.  Skin:    General: Skin is warm and dry.     Coloration: Skin is not cyanotic, jaundiced or pale.  Neurological:     Mental Status: She is alert.     GCS: GCS eye subscore is 2. GCS verbal subscore is 2. GCS motor subscore is 5.     Cranial Nerves: No facial asymmetry.     Comments: Moving all 4 extremities Neg clonus Neg hoffman   Gait testing deferred secondary to patient safety.   Psychiatric:        Speech: Speech normal.        Behavior: Behavior normal. Behavior is cooperative.     ED Results and Treatments Labs (all labs ordered are listed, but only abnormal results are displayed) Labs Reviewed  CBC WITH DIFFERENTIAL/PLATELET - Abnormal; Notable for the following components:      Result Value   WBC 3.3 (*)    All other components within normal limits  COMPREHENSIVE METABOLIC PANEL WITH GFR - Abnormal; Notable for the following components:   CO2 19 (*)    Glucose, Bld 235 (*)    Creatinine, Ser 1.13 (*)    Calcium 8.6 (*)    Total Protein 6.3 (*)    All other components within normal limits  CBG MONITORING, ED - Abnormal; Notable for the  following components:   Glucose-Capillary 229 (*)    All other components within normal limits  I-STAT CHEM  8, ED - Abnormal; Notable for the following components:   Glucose, Bld 241 (*)    TCO2 17 (*)    All other components within normal limits  URINALYSIS, ROUTINE W REFLEX MICROSCOPIC  RAPID URINE DRUG SCREEN, HOSP PERFORMED  AMMONIA  SALICYLATE LEVEL  ACETAMINOPHEN  LEVEL  MAGNESIUM  TSH  T4, FREE  ETHANOL  CK                                                                                                                          Radiology DG Chest Portable 1 View Result Date: 09/12/2023 CLINICAL DATA:  Migraine, seizure, weakness, and difficulty speaking. EXAM: PORTABLE CHEST 1 VIEW COMPARISON:  None Available. FINDINGS: The heart is normal in size and the mediastinal contour is within normal limits. Mild airspace opacities are seen at the lung bases. No effusion or pneumothorax is seen. No acute osseous abnormality. IMPRESSION: Mild airspace disease at the lung bases, possible atelectasis or infiltrate. Electronically Signed   By: Leita Birmingham M.D.   On: 09/12/2023 15:39   CT Head Wo Contrast Result Date: 09/12/2023 CLINICAL DATA:  Seizure, new-onset, no history of trauma new seizure/ ha EXAM: CT HEAD WITHOUT CONTRAST TECHNIQUE: Contiguous axial images were obtained from the base of the skull through the vertex without intravenous contrast. RADIATION DOSE REDUCTION: This exam was performed according to the departmental dose-optimization program which includes automated exposure control, adjustment of the mA and/or kV according to patient size and/or use of iterative reconstruction technique. COMPARISON:  None Available. FINDINGS: Brain: Patchy and confluent areas of decreased attenuation are noted throughout the deep and periventricular white matter of the cerebral hemispheres bilaterally, compatible with chronic microvascular ischemic disease. No evidence of large-territorial acute  infarction. No parenchymal hemorrhage. No mass lesion. No extra-axial collection. No mass effect or midline shift. No hydrocephalus. Basilar cisterns are patent. Vascular: No hyperdense vessel. Skull: No acute fracture or focal lesion. Sinuses/Orbits: Paranasal sinuses and mastoid air cells are clear. The orbits are unremarkable. Other: None. IMPRESSION: No acute intracranial abnormality. Electronically Signed   By: Morgane  Naveau M.D.   On: 09/12/2023 14:56    Pertinent labs & imaging results that were available during my care of the patient were reviewed by me and considered in my medical decision making (see MDM for details).  Medications Ordered in ED Medications  sodium chloride  0.9 % bolus 1,000 mL (1,000 mLs Intravenous New Bag/Given 09/12/23 1435)  ondansetron  (ZOFRAN ) injection 4 mg (4 mg Intravenous Given 09/12/23 1523)  levETIRAcetam  (KEPPRA ) undiluted injection 1,500 mg (1,500 mg Intravenous Given 09/12/23 1445)  Procedures .Critical Care  Performed by: Elnor Jayson LABOR, DO Authorized by: Elnor Jayson LABOR, DO   Critical care provider statement:    Critical care time (minutes):  30   Critical care time was exclusive of:  Separately billable procedures and treating other patients   Critical care was necessary to treat or prevent imminent or life-threatening deterioration of the following conditions:  CNS failure or compromise   Critical care was time spent personally by me on the following activities:  Development of treatment plan with patient or surrogate, discussions with consultants, evaluation of patient's response to treatment, examination of patient, ordering and review of laboratory studies, ordering and review of radiographic studies, ordering and performing treatments and interventions, pulse oximetry, re-evaluation of patient's condition, review of old  charts and obtaining history from patient or surrogate   (including critical care time)  Medical Decision Making / ED Course    Medical Decision Making:    Lorrena Goranson is a 48 y.o. female with past medical history as below, significant for migraine headaches, GAD, adrenal insufficiency who presents to the ED with complaint of seizure/ HA. The complaint involves an extensive differential diagnosis and also carries with it a high risk of complications and morbidity.  Serious etiology was considered. Ddx includes but is not limited to: Differential diagnosis includes but is not exclusive to subarachnoid hemorrhage, meningitis, encephalitis, previous head trauma, cavernous venous thrombosis, muscle tension headache, glaucoma, temporal arteritis, migraine or migraine equivalent, etc.   Complete initial physical exam performed, notably the patient was postictal, not following commands Reviewed and confirmed nursing documentation for past medical history, family history, social history.  Vital signs reviewed.    Headache Seizure > - No formal seizure history per EMS/chart review - Seizure precautions in place - Given report of headache with seizure we will obtain CT head, code medical was ordered to expedite imaging - pt with witnessed seizure x2 today, prolonged post - ictal period, no return to baseline between seizure episodes, concern for status, give keppra  1500mg  - CTH stable - Spoke with Dr Matthews w/ neuro, will come to eval  Emesis ?aspiration > - pt with copious emesis following seizure, concern for aspiration - CXR with some infiltrate, favor aspiration pneumonitis        Handoff Dr Pamella pending remainder of workup and eventual admission               Additional history obtained: -Additional history obtained from ems -External records from outside source obtained and reviewed including: Chart review including previous notes, labs, imaging, consultation notes  including  Home meds    Lab Tests: -I ordered, reviewed, and interpreted labs.   The pertinent results include:   Labs Reviewed  CBC WITH DIFFERENTIAL/PLATELET - Abnormal; Notable for the following components:      Result Value   WBC 3.3 (*)    All other components within normal limits  COMPREHENSIVE METABOLIC PANEL WITH GFR - Abnormal; Notable for the following components:   CO2 19 (*)    Glucose, Bld 235 (*)    Creatinine, Ser 1.13 (*)    Calcium 8.6 (*)    Total Protein 6.3 (*)    All other components within normal limits  CBG MONITORING, ED - Abnormal; Notable for the following components:   Glucose-Capillary 229 (*)    All other components within normal limits  I-STAT CHEM 8, ED - Abnormal; Notable for the following components:   Glucose, Bld 241 (*)    TCO2  17 (*)    All other components within normal limits  URINALYSIS, ROUTINE W REFLEX MICROSCOPIC  RAPID URINE DRUG SCREEN, HOSP PERFORMED  AMMONIA  SALICYLATE LEVEL  ACETAMINOPHEN  LEVEL  MAGNESIUM  TSH  T4, FREE  ETHANOL  CK    Notable for labs stable so far  EKG   EKG Interpretation Date/Time:  Sunday September 12 2023 14:21:52 EDT Ventricular Rate:  109 PR Interval:    QRS Duration:  90 QT Interval:  356 QTC Calculation: 480 R Axis:   82  Text Interpretation: Interpretation limited secondary to artifact Sinus tachycardia Confirmed by Elnor Savant ([id number]) on 09/12/2023 2:28:42 PM         Imaging Studies ordered: I ordered imaging studies including CXR CTH I independently visualized the following imaging with scope of interpretation limited to determining acute life threatening conditions related to emergency care; findings noted above I agree with the radiologist interpretation If any imaging was obtained with contrast I closely monitored patient for any possible adverse reaction a/w contrast administration in the emergency department   Medicines ordered and prescription drug management: Meds ordered  this encounter  Medications   sodium chloride  0.9 % bolus 1,000 mL   ondansetron  (ZOFRAN ) injection 4 mg   levETIRAcetam  (KEPPRA ) undiluted injection 1,500 mg    -I have reviewed the patients home medicines and have made adjustments as needed   Consultations Obtained: I requested consultation with the neurology ,  and discussed lab and imaging findings as well as pertinent plan - they recommend: will come eval   Cardiac Monitoring: The patient was maintained on a cardiac monitor.  I personally viewed and interpreted the cardiac monitored which showed an underlying rhythm of: nsr Continuous pulse oximetry interpreted by myself, 100% on ra.    Social Determinants of Health:  Diagnosis or treatment significantly limited by social determinants of health: na   Reevaluation: After the interventions noted above, I reevaluated the patient and found that they have improved  Co morbidities that complicate the patient evaluation  Past Medical History:  Diagnosis Date   Abdominal pain    Acute otitis media    Adrenal insufficiency (Addison's disease) (HCC)    Candida vaginitis    Cellulitis of nasal tip    Conjunctivitis    Cough    Depression    Diastasis recti    GAD (generalized anxiety disorder)    Hypothyroid    Irregular heart rate    Migraine    Migraines    Migraines    Murmur    Nausea    Nipple dermatitis    Otitis externa    Panic attack    Pharyngitis    Pneumonia due to COVID-19 virus    Sciatica    Sinusitis    Spondylisthesis    Umbilical hernia    UTI (urinary tract infection)    Vomiting       Dispostion: Disposition decision including need for hospitalization was considered, and patient disposition pending at time of sign out.    Final Clinical Impression(s) / ED Diagnoses Final diagnoses:  Seizure-like activity (HCC)  Nonintractable headache, unspecified chronicity pattern, unspecified headache type  Altered mental status, unspecified  altered mental status type        Elnor Savant LABOR, DO 09/12/23 1546

## 2023-09-12 NOTE — ED Notes (Signed)
 Was notified by MRI staff that patient was not tolerating exam well. They are returning her, per neurologist MD Matthews, we will try again tonight or in the morning.

## 2023-09-12 NOTE — ED Provider Notes (Signed)
  Physical Exam  BP 111/65   Pulse 95   Temp 97.6 F (36.4 C) (Axillary)   Resp 19   Ht 5' 1 (1.549 m)   Wt 50.8 kg   SpO2 100%   BMI 21.16 kg/m   Physical Exam Vitals and nursing note reviewed.  HENT:     Head: Normocephalic and atraumatic.  Eyes:     Pupils: Pupils are equal, round, and reactive to light.  Cardiovascular:     Rate and Rhythm: Normal rate and regular rhythm.  Pulmonary:     Effort: Pulmonary effort is normal.     Breath sounds: Normal breath sounds.  Abdominal:     Palpations: Abdomen is soft.     Tenderness: There is no abdominal tenderness.  Skin:    General: Skin is warm and dry.  Neurological:     Mental Status: She is alert.  Psychiatric:        Mood and Affect: Mood normal.     Procedures  Procedures  ED Course / MDM   Clinical Course as of 09/12/23 1827  Sun Sep 12, 2023  1826 Dr. Matthews (neurology) has evaluated patient in the ED.  See neuro consult no further recommendations.  Will obtain MRI.  Today and patient will need further evaluation for potential cerebral sinus thrombosis with MRV.  Patient is coming around department reporting a headache.  Improvement from her postictal state.  No recurrent seizure activity.  She has received Keppra .  Discussed with Dr. Seena (hospitalist) who will admit patient to his service  [MP]    Clinical Course User Index [MP] Pamella Ozell LABOR, DO   Medical Decision Making I, Ozell Pamella DO, have assumed care of this patient from the previous provider pending neurology evaluation and likely admission  Amount and/or Complexity of Data Reviewed Labs: ordered. Radiology: ordered.  Risk Prescription drug management. Decision regarding hospitalization.          Pamella Ozell LABOR, DO 09/12/23 1827

## 2023-09-12 NOTE — ED Notes (Addendum)
 MD, RT at bedside for possible intubation. Pt vomiting and is unable to maintain and protect airway. Emesis is very thick and chunky and suction is not maintaining enough for adequate airway protection protection.

## 2023-09-12 NOTE — ED Notes (Signed)
 Report given to receiving nurse on 5W

## 2023-09-12 NOTE — ED Triage Notes (Addendum)
 Pt bib [facility] from home for initial complaints of migraine. Pt had difficulty speaking, weakness. Pt had seizure while husband was on phone was 911. Upon ems arrival pt was post ictal. When ems moved pt to truck patient had witnessed seizure and turned apneic. Pt was not verbally responsive for whole encounter. CBG 41 from ems. Ems provided 25g dextrose. Pt went out last night and had approximately 3 drinks.  Upon arrival patient began vomiting.   **pt has migraine hx. CBG   EMS VS 80/40 106 HR    500 NS given

## 2023-09-12 NOTE — ED Notes (Signed)
 CCMD called.

## 2023-09-12 NOTE — ED Notes (Addendum)
 Pt is now responding to pain and spontaneously opening eye. Patient was responsive to pain and asked what are you doing. Pt is still unable to answer any other questions Pt is not vomiting at this time.

## 2023-09-12 NOTE — ED Notes (Signed)
   Pt transported to ct

## 2023-09-12 NOTE — H&P (Signed)
 History and Physical   Brittany Rogers FMW:[id number] DOB: [birth year] DOA: 09/12/2023  PCP: Selmer Gilmer HERO, MD   Patient coming from: Home  Chief Complaint: Headache and seizure  HPI: Brittany Rogers is a 48 y.o. female with medical history significant of hypothyroidism, migraines, hernia presenting with headache and seizure.  Patient had a migraine earlier today which was severe.  Has not was calling EMS and while he was on the phone with 911 she had a seizure that he witnessed.  On EMS arrival patient was postictal and en route patient had an additional seizure.  During that time she was noted to have apnea episode and nausea and vomiting.  Husband reports she has had some issues with nasal congestion ongoing and she has been using Afrin multiple times a day for up to 6 weeks.  PCP had put her on a antibiotic for possible sinus infection as well.  Patient remains postictal and unable to fully participate in review of systems.  ED Course: Vital signs in the ED notable for heart rate in the 90s-110 systolic.  Lab workup included CMP with bicarb 19, creatinine 1.13, glucose 235, calcium 8.6, protein 6.3.  CBC with leukopenia 3.3.  Magnesium normal.  Chest x-ray showed mild airspace disease at the bases.  CT head showed no acute Sharol.  MRI brain and MRV are pending.  Patient received Toradol , Compazine , Reglan , Zofran , Decadron , Keppra  in the ED.  Neurology has seen the patient and are following.  Recommended MRI and MRV and have given the above migraine cocktail.  Review of Systems: Patient remains postictal and unable to fully participate in review of systems.  Past Medical History:  Diagnosis Date   Abdominal pain    Acute otitis media    Adrenal insufficiency (Addison's disease) (HCC)    Candida vaginitis    Cellulitis of nasal tip    Conjunctivitis    Cough    Depression    Diastasis recti    GAD (generalized anxiety disorder)    Hypothyroid    Irregular heart rate    Migraine     Migraine without aura and responsive to treatment 10/19/2012   Migraines    Migraines    Murmur    Nausea    Nipple dermatitis    Otitis externa    Panic attack    Pharyngitis    Pneumonia due to COVID-19 virus    Sciatica    Sinusitis    Spondylisthesis    Umbilical hernia    UTI (urinary tract infection)    Vomiting     History reviewed. No pertinent surgical history.  Social History  reports that she has never smoked. She has never used smokeless tobacco. She reports current alcohol use. No history on file for drug use.  Allergies  Allergen Reactions   Latex     History reviewed. No pertinent family history.   Prior to Admission medications   Medication Sig Start Date End Date Taking? Authorizing Provider  EMGALITY 120 MG/ML SOAJ Inject 120 mg into the skin every 30 (thirty) days. 12/20/19  Yes [provider]  LORazepam  (ATIVAN ) 0.5 MG tablet Take 0.5-1 mg by mouth every 6 (six) hours as needed for anxiety (seizure-like activity).   Yes [provider]  NONFORMULARY OR COMPOUNDED ITEM Compounded T3/T4 thyroid hormone therapy. Dosage unknown. Prescribed by Dr Mliss Height with [facility] in [facility] [place]   Yes [provider]  OVER THE COUNTER MEDICATION Take 1 tablet by mouth daily. Caffeine +  Vitamin supplement (300mg  of caffeine / tablet, vitamins unknown)   Yes [provider]  rizatriptan (MAXALT) 10 MG tablet Take 10 mg by mouth as needed for migraine. 12/20/19  Yes [provider]  topiramate  (TOPAMAX ) 100 MG tablet Take 100 mg by mouth 2 (two) times daily. To be given in addition to topiramate  50mg  for a total of 150mg  per dose (300mg /24hrs). 12/06/19  Yes [provider]  topiramate  (TOPAMAX ) 50 MG tablet Take 50 mg by mouth 2 (two) times daily. To be given in addition to topiramate  100mg  for a total of 150mg  per dose (300mg schuyler). 10/04/19  Yes [provider]    Physical Exam: Vitals:   09/12/23  1545 09/12/23 1600 09/12/23 1715 09/12/23 1730  BP: 114/69 107/67 116/72 111/65  Pulse: (!) 105 (!) 102 100 95  Resp: 13 19 15 19   Temp:      TempSrc:      SpO2: 100% 100% 99% 100%  Weight:      Height:        Physical Exam Constitutional:      General: She is not in acute distress.    Appearance: Normal appearance.     Comments: Postictal  HENT:     Head: Normocephalic and atraumatic.     Mouth/Throat:     Mouth: Mucous membranes are moist.     Pharynx: Oropharynx is clear.  Eyes:     Extraocular Movements: Extraocular movements intact.     Pupils: Pupils are equal, round, and reactive to light.  Cardiovascular:     Rate and Rhythm: Normal rate and regular rhythm.     Pulses: Normal pulses.     Heart sounds: Normal heart sounds.  Pulmonary:     Effort: Pulmonary effort is normal. No respiratory distress.     Breath sounds: Normal breath sounds.  Abdominal:     General: Bowel sounds are normal. There is no distension.     Palpations: Abdomen is soft.     Tenderness: There is no abdominal tenderness.  Musculoskeletal:        General: No swelling or deformity.  Skin:    General: Skin is warm and dry.  Neurological:     General: No focal deficit present.     Comments: Drowsy/postictal on scene.  Moves all 4 limbs spontaneously no gross sensory or motor deficit.    Labs on Admission: I have personally reviewed following labs and imaging studies  CBC: Recent Labs  Lab 09/12/23 1352 09/12/23 1356  WBC 3.3*  --   NEUTROABS 2.1  --   HGB 14.5 15.0  HCT 44.2 44.0  MCV 94.6  --   PLT 169  --     Basic Metabolic Panel: Recent Labs  Lab 09/12/23 1352 09/12/23 1356 09/12/23 1447  NA 138 140  --   K 3.9 4.1  --   CL 106 108  --   CO2 19*  --   --   GLUCOSE 235* 241*  --   BUN 17 20  --   CREATININE 1.13* 1.00  --   CALCIUM 8.6*  --   --   MG  --   --  1.9    GFR: Estimated Creatinine Clearance: 52.5 mL/min (by C-G formula based on SCr of 1  mg/dL).  Liver Function Tests: Recent Labs  Lab 09/12/23 1352  AST 33  ALT 36  ALKPHOS 42  BILITOT 0.3  PROT 6.3*  ALBUMIN 3.7    Urine analysis:  Component Value Date/Time   COLORURINE YELLOW 09/12/2023 1447   APPEARANCEUR TURBID (A) 09/12/2023 1447   LABSPEC 1.015 09/12/2023 1447   PHURINE 8.0 09/12/2023 1447   GLUCOSEU 150 (A) 09/12/2023 1447   HGBUR NEGATIVE 09/12/2023 1447   BILIRUBINUR NEGATIVE 09/12/2023 1447   KETONESUR NEGATIVE 09/12/2023 1447   PROTEINUR NEGATIVE 09/12/2023 1447   NITRITE NEGATIVE 09/12/2023 1447   LEUKOCYTESUR NEGATIVE 09/12/2023 1447    Radiological Exams on Admission: DG Chest Portable 1 View Result Date: 09/12/2023 CLINICAL DATA:  Migraine, seizure, weakness, and difficulty speaking. EXAM: PORTABLE CHEST 1 VIEW COMPARISON:  None Available. FINDINGS: The heart is normal in size and the mediastinal contour is within normal limits. Mild airspace opacities are seen at the lung bases. No effusion or pneumothorax is seen. No acute osseous abnormality. IMPRESSION: Mild airspace disease at the lung bases, possible atelectasis or infiltrate. Electronically Signed   By: Leita Birmingham M.D.   On: 09/12/2023 15:39   CT Head Wo Contrast Result Date: 09/12/2023 CLINICAL DATA:  Seizure, new-onset, no history of trauma new seizure/ ha EXAM: CT HEAD WITHOUT CONTRAST TECHNIQUE: Contiguous axial images were obtained from the base of the skull through the vertex without intravenous contrast. RADIATION DOSE REDUCTION: This exam was performed according to the departmental dose-optimization program which includes automated exposure control, adjustment of the mA and/or kV according to patient size and/or use of iterative reconstruction technique. COMPARISON:  None Available. FINDINGS: Brain: Patchy and confluent areas of decreased attenuation are noted throughout the deep and periventricular white matter of the cerebral hemispheres bilaterally, compatible with chronic  microvascular ischemic disease. No evidence of large-territorial acute infarction. No parenchymal hemorrhage. No mass lesion. No extra-axial collection. No mass effect or midline shift. No hydrocephalus. Basilar cisterns are patent. Vascular: No hyperdense vessel. Skull: No acute fracture or focal lesion. Sinuses/Orbits: Paranasal sinuses and mastoid air cells are clear. The orbits are unremarkable. Other: None. IMPRESSION: No acute intracranial abnormality. Electronically Signed   By: Morgane  Naveau M.D.   On: 09/12/2023 14:56   EKG: Independently reviewed. Sinus tachycardia at 109 bpm.  Nonspecific T wave changes.    Assessment/Plan Principal Problem:   Seizure (HCC) Active Problems:   Migraines   Seizure > Patient with a seizure episode following severe migraine onset. > Husband states that she had some word finding/speech difficulty at least 10 minutes preceding the seizure. > Had a second seizure en route with EMS.  Did have some apnea/nausea/vomiting associated with this. > CT head showed no acute abnormality. > Has been evaluated by neurology in the ED who recommended MRI and MRV and has been started on Keppra . - Monitor on telemetry overnight - Appreciate neurology recommendations and assistance - Continue with Keppra  - MRI, MRV - EEG  Migraine > As recovering in her postictal state patient is still reporting migraine/headache symptoms. > Has received 2 migraine cocktails. - Neurology recommends 1 g Depakote if headache persists and pregnancy test is negative  Congestion Suspect rebound congestion > Patient with ongoing nasal congestion.  Husband reports she has been taking nasal doses of Afrin for 6 weeks or so.  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 > Has also been treated for sinus infection by PCP. - Hold off on further Afrin - Flonase   DVT prophylaxis: Lovenox  Code Status:   Full Family Communication:  Updated bedside Disposition Plan:    Patient is from:  Home  Anticipated DC to:  Home  Anticipated DC date:  1 to 3 days  Anticipated DC barriers: None  Consults called:  Neurology Admission status:  Observation, telemetry  Severity of Illness: The appropriate patient status for this patient is OBSERVATION. Observation status is judged to be reasonable and necessary in order to provide the required intensity of service to ensure the patient's safety. The patient's presenting symptoms, physical exam findings, and initial radiographic and laboratory data in the context of their medical condition is felt to place them at decreased risk for further clinical deterioration. Furthermore, it is anticipated that the patient will be medically stable for discharge from the hospital within 2 midnights of admission.    Marsa KATHEE Scurry MD [facility]  How to contact the [facility] Attending or Consulting provider 7A - 7P or covering provider during after hours 7P -7A, for this patient?   Check the care team in [facility] and look for a) attending/consulting [facility] provider listed and b) the [facility] team listed Log into [email or web address] and use [facility] universal password to access. If you do not have the password, please contact the hospital operator. Locate the [facility] provider you are looking for under [facility] and page to a number that you can be directly reached. If you still have difficulty reaching the provider, please page the [facility] (Director on Call) for the Hospitalists listed on amion for assistance.  09/12/2023, 6:42 PM

## 2023-09-13 ENCOUNTER — Observation Stay (HOSPITAL_COMMUNITY)

## 2023-09-13 DIAGNOSIS — G43809 Other migraine, not intractable, without status migrainosus: Secondary | ICD-10-CM | POA: Diagnosis not present

## 2023-09-13 DIAGNOSIS — R569 Unspecified convulsions: Secondary | ICD-10-CM | POA: Diagnosis not present

## 2023-09-13 LAB — CBC
HCT: 42.2 % (ref 36.0–46.0)
Hemoglobin: 14.4 g/dL (ref 12.0–15.0)
MCH: 31.2 pg (ref 26.0–34.0)
MCHC: 34.1 g/dL (ref 30.0–36.0)
MCV: 91.5 fL (ref 80.0–100.0)
Platelets: 175 K/uL (ref 150–400)
RBC: 4.61 MIL/uL (ref 3.87–5.11)
RDW: 12.3 % (ref 11.5–15.5)
WBC: 7.7 K/uL (ref 4.0–10.5)
nRBC: 0 % (ref 0.0–0.2)

## 2023-09-13 LAB — COMPREHENSIVE METABOLIC PANEL WITH GFR
ALT: 28 U/L (ref 0–44)
AST: 23 U/L (ref 15–41)
Albumin: 3.2 g/dL — ABNORMAL LOW (ref 3.5–5.0)
Alkaline Phosphatase: 40 U/L (ref 38–126)
Anion gap: 10 (ref 5–15)
BUN: 15 mg/dL (ref 6–20)
CO2: 20 mmol/L — ABNORMAL LOW (ref 22–32)
Calcium: 8.9 mg/dL (ref 8.9–10.3)
Chloride: 113 mmol/L — ABNORMAL HIGH (ref 98–111)
Creatinine, Ser: 1 mg/dL (ref 0.44–1.00)
GFR, Estimated: >60 mL/min (ref >60)
Glucose, Bld: 111 mg/dL — ABNORMAL HIGH (ref 70–99)
Potassium: 4.2 mmol/L (ref 3.5–5.1)
Sodium: 143 mmol/L (ref 135–145)
Total Bilirubin: 1.1 mg/dL (ref 0.0–1.2)
Total Protein: 5.6 g/dL — ABNORMAL LOW (ref 6.5–8.1)

## 2023-09-13 LAB — HIV ANTIBODY (ROUTINE TESTING W REFLEX): HIV Screen 4th Generation wRfx: NONREACTIVE

## 2023-09-13 MED ORDER — TOPIRAMATE 25 MG PO TABS
50.0000 mg | ORAL_TABLET | Freq: Two times a day (BID) | ORAL | Status: DC
  Administered 2023-09-13: 50 mg via ORAL

## 2023-09-13 MED ORDER — FLUTICASONE PROPIONATE 50 MCG/ACT NA SUSP: 2.0000 | Freq: Every day | NASAL | Status: DC

## 2023-09-13 MED ORDER — TOPIRAMATE 25 MG PO TABS: 150.0000 mg | ORAL_TABLET | Freq: Every day | ORAL | Status: DC

## 2023-09-13 MED ORDER — TOPIRAMATE 100 MG PO TABS: ORAL_TABLET | ORAL | Status: DC

## 2023-09-13 MED ORDER — LORATADINE 10 MG PO TABS: 10.0000 mg | ORAL_TABLET | Freq: Every day | ORAL | Status: DC

## 2023-09-13 MED ORDER — TOPIRAMATE 25 MG PO TABS: 100.0000 mg | ORAL_TABLET | Freq: Every day | ORAL | Status: DC

## 2023-09-13 NOTE — Progress Notes (Signed)
 NEUROLOGY CONSULT FOLLOW UP NOTE   Date of service: September 13, 2023 Patient Name: Brittany Rogers MRN:  [id number] DOB:  18-Sep-1975  Interval Hx/subjective  Patient remains hemodynamically stable and afebrile overnight.  She has had no further seizure activity.  She states that she does not wish to start taking Keppra  daily but is willing to increase her Topamax . Vitals   Vitals:   09/12/23 2115 09/13/23 0022 09/13/23 0807 09/13/23 1139  BP: (!) 102/56 (!) 102/53 (!) 99/54 (!) 99/56  Pulse: 67 66 75 64  Resp: 17 18 17    Temp: 97.9 F (36.6 C)  98 F (36.7 C)   TempSrc: Oral  Oral   SpO2: 98% 94% 96% 98%  Weight:      Height:         Body mass index is 21.16 kg/m.  Physical Exam   Constitutional: Appears well-developed and well-nourished.  Psych: Affect appropriate to situation.  Anxious at times Eyes: No scleral injection.  HENT: No OP obstrucion.  Head: Normocephalic.  Cardiovascular: Normal rate and regular rhythm.  Respiratory: Effort normal, non-labored breathing.  Skin: WDI.   Neurologic Examination    NEURO:  Mental Status: Alert and oriented to person place time and situation, able to give clear and coherent history of present illness Speech/Language: speech is clear and fluent  Cranial Nerves:  II: PERRL.  III, IV, VI: Tracks examiner around the room VII: This is symmetric resting and smiling VIII: hearing intact to voice. IX, X:  Phonation is normal.  Motor: Able to move all 4 extremities with good antigravity strength Tone: is normal and bulk is normal Gait- deferred   Medications  Current Facility-Administered Medications:    acetaminophen  (TYLENOL ) tablet 650 mg, 650 mg, Oral, Q6H PRN **OR** acetaminophen  (TYLENOL ) suppository 650 mg, 650 mg, Rectal, Q6H PRN, Seena Marsa NOVAK, MD   enoxaparin  (LOVENOX ) injection 40 mg, 40 mg, Subcutaneous, Q24H, Melvin, Alexander B, MD   fluticasone  (FLONASE ) 50 MCG/ACT nasal spray 2 spray, 2 spray, Each Nare,  Daily, Seena Marsa NOVAK, MD, 2 spray at 09/13/23 0940   levETIRAcetam  (KEPPRA ) tablet 500 mg, 500 mg, Oral, BID, Melvin, Alexander B, MD   LORazepam  (ATIVAN ) injection 2 mg, 2 mg, Intravenous, Q5 Min x 2 PRN, Melvin, Alexander B, MD   ondansetron  (ZOFRAN ) tablet 4 mg, 4 mg, Oral, Q6H PRN **OR** ondansetron  (ZOFRAN ) injection 4 mg, 4 mg, Intravenous, Q6H PRN, Melvin, Alexander B, MD   polyethylene glycol (MIRALAX  / GLYCOLAX ) packet 17 g, 17 g, Oral, Daily PRN, Melvin, Alexander B, MD   sodium chloride  flush (NS) 0.9 % injection 3 mL, 3 mL, Intravenous, Q12H, Melvin, Alexander B, MD, 3 mL at 09/13/23 0940   [START ON 09/14/2023] topiramate  (TOPAMAX ) tablet 100 mg, 100 mg, Oral, Daily **AND** topiramate  (TOPAMAX ) tablet 150 mg, 150 mg, Oral, QHS, de Clint Abbey Feather E, NP  Labs and Diagnostic Imaging   CBC:  Recent Labs  Lab 09/12/23 1352 09/12/23 1356 09/13/23 0557  WBC 3.3*  --  7.7  NEUTROABS 2.1  --   --   HGB 14.5 15.0 14.4  HCT 44.2 44.0 42.2  MCV 94.6  --  91.5  PLT 169  --  175    Basic Metabolic Panel:  Lab Results  Component Value Date   NA 143 09/13/2023   K 4.2 09/13/2023   CO2 20 (L) 09/13/2023   GLUCOSE 111 (H) 09/13/2023   BUN 15 09/13/2023   CREATININE 1.00 09/13/2023   CALCIUM 8.9 09/13/2023  GFRNONAA >60 09/13/2023   Lipid Panel: No results found for: LDLCALC HgbA1c: No results found for: HGBA1C Urine Drug Screen:     Component Value Date/Time   LABOPIA NONE DETECTED 09/12/2023 1447   COCAINSCRNUR NONE DETECTED 09/12/2023 1447   LABBENZ POSITIVE (A) 09/12/2023 1447   AMPHETMU NONE DETECTED 09/12/2023 1447   THCU NONE DETECTED 09/12/2023 1447   LABBARB NONE DETECTED 09/12/2023 1447    Alcohol Level     Component Value Date/Time   ETH <15 09/12/2023 1447    CT Head without contrast(Personally reviewed): No acute abnormality  MRI Brain(Personally reviewed): No acute abnormality, 3.3 cm focus of FLAIR hyperintense lesion involving cortical  to subcortical posterior left frontotemporal region, underlying microvascular ischemic disease, normal MRV  rEEG:  Continuous slow left parietal occipital region, suggestive of cortical dysfunction arising from the left parieto-occipital region likely secondary to underlying structural abnormality with no seizures or epileptiform discharges  Assessment   Brittany Rogers is a 48 y.o. female with history of migraines and brain biopsy in 2010 (which was benign) who presented with migraine and witnessed seizure episode with decreased responsiveness postictally.  Patient states that she had had 1 seizure 10 years in the past which occurred after ingestion of a lot of caffeine as well as 6 sumatriptan injections.  She states that prior to admission, she had gone out at night with her friends, consumed some alcohol and had been somewhat sleep deprived.  The next morning, she became disoriented, then became unresponsive and had tensing up of the right hand and arm in a contracting fashion.  EMS was called, and patient had a witnessed seizure while en route to the hospital.  Since arrival to the hospital, she has had no further seizure activity.  She received Versed and was given Keppra  but states that she does not wish to remain on this medication.  She does take Topamax  for migraines and is amenable to an increase in this medication for seizure control.  Discussed seizure precautions, including 26-month driving restriction with patient and husband and directed them to follow-up with outpatient neurology.  Recommendations  -Increase Topamax  to 100 mg in the morning and 150 mg in the evening - Follow-up with outpatient neurology for seizure disorder   Discussed [place]  DMV statutes, patients with seizures are not allowed to drive until they have been seizure-free for six months Use caution when using heavy equipment or power tools. Avoid working on ladders or at heights. Take showers instead of baths. Ensure  the water temperature is not too high on the home water heater. Do not go swimming alone. Do not lock yourself in a room alone (i.e. bathroom). When caring for infants or small children, sit down when holding, feeding, or changing them to minimize risk of injury to the child in the event you have a seizure. Maintain good sleep hygiene. Avoid alcohol.   ______________________________________________________________________ Patient seen by NP with MD, MD to edit note as needed.  Signed, Cortney E Everitt Clint Kill, NP [facility]   Patient seen with MD. Agree with note as above. Liannah Yarbough, MD

## 2023-09-13 NOTE — Progress Notes (Signed)
 This RN attempted to process pt home meds in pill box at the bedside so patient could take Topamax  and T3/T4 compounded. Patient did not want to wait for medication order from MD.This RN communicated to pharmacy that the patient did not have pill bottles only single pill in pill box of Topamax  and T3/T4 compounded. 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 I need to take my pills then proceeded to take home pills from pill box. Pt stated she took 150 mg of Topomax and her T3/T4. Ghimire, MD at the bedside now and made aware.    Lynzee Lindquist Soto, RN

## 2023-09-13 NOTE — Discharge Summary (Addendum)
 PATIENT DETAILS Name: Brittany Rogers Age: 48 y.o. Sex: female Date of Birth: Nov 26, 1975 MRN: [id number]. Admitting Physician: Marsa KATHEE Scurry, MD ERE:Ajry, Gilmer HERO, MD  Admit Date: 09/12/2023 Discharge date: 09/13/2023  Recommendations for Outpatient Follow-up:  Follow up with PCP in 1-2 weeks Please obtain CMP/CBC in one week Please ensure follow-up with neurology.  Admitted From:  Home  Disposition: Home   Discharge Condition: good  CODE STATUS:   Code Status: Full Code   Diet recommendation:  Diet Order             Diet general           Diet regular Room service appropriate? Yes; Fluid consistency: Thin  Diet effective now                    Brief Summary: 48 year old with history of longstanding migraine headaches, known left brain lesion (per history-remote biopsy-benign-previously evaluated at [facility] clinic)-presented with seizure.  Significant studies 8/31>> MRI brain: No acute abnormality, 3.3 cm lesion left frontoparietal temporal region. 8/31>> MRV brain: No dural venous sinus thrombosis 8/31>> UDS:+ve benzos (per spouse-patient took Ativan  prior to coming to the hospital) 9/01>> EEG: No seizures  Brief Hospital Course: Seizure Known left brain lesion-lack of sleep/some EtOH when she went out with her friends the night before-could have provoked the seizure by lowering seizure threshold. Has had a prior seizure in 2018/2019 that was reportedly secondary to excessive use of migraine medication/Imitrex. She is maintained on Topamax -probably for migraine headache prophylaxis,  She was evaluated in the ED-loaded with Keppra -underwent MRI brain/MRV-which was negative for acute abnormalities/sinus venous thrombosis-EEG was negative for seizures.  Patient not keen on taking Keppra -neurology reevaluated patient 9/1-recommendations are to increase Topamax  to 100 mg in morning and 150 mg in the evening. Epic referral to [facility] neurology sent Patient  aware of driving restrictions/seizure precautions.  Longstanding history of migraine headaches Had some headache yesterday-but none today Continue Topamax  Continue triptan for rescue. Follow-up with outpatient neurology-epic referral sent  Known history of left brain lesion Follow-up with neurology-probably needs repeat MRI in the next several months to ensure stability in size. Per chart review-neurology note in 2014 (see care everywhere)-she was evaluated at [facility] was biopsied-and thought to be benign.  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   Stop Afrin-and in the future-limit use to 3 days.   Discharge Diagnoses:  Principal Problem:   Seizure [facility]) Active Problems:   Migraines   Discharge Instructions:  Activity:  As tolerated  Discharge Instructions     Ambulatory referral to Neurology   Complete by: As directed    An appointment is requested in approximately: 4 weeks   Call MD for:   Complete by: As directed    Recurrent seizures   Call MD for:  extreme fatigue   Complete by: As directed    Call MD for:  persistant dizziness or light-headedness   Complete by: As directed    Diet general   Complete by: As directed    Discharge instructions   Complete by: As directed    Follow with Primary MD  Selmer Gilmer HERO, MD in 1-2 weeks  Follow-up with [facility] neurology-their office will give you a call-if you not hear from them-give them a call.  Please get a complete blood count and chemistry panel checked by your Primary MD at your next visit, and  again as instructed by your Primary MD.  Get Medicines reviewed and adjusted: Please take all your medications with you for your next visit with your Primary MD  Laboratory/radiological data: Please request your Primary MD to go over all hospital tests and  procedure/radiological results at the follow up, please ask your Primary MD to get all Hospital records sent to his/her office.  In some cases, they will be blood work, cultures and biopsy results pending at the time of your discharge. Please request that your primary care M.D. follows up on these results.  Also Note the following: If you experience worsening of your admission symptoms, develop shortness of breath, life threatening emergency, suicidal or homicidal thoughts you must seek medical attention immediately by calling 911 or calling your MD immediately  if symptoms less severe.  You must read complete instructions/literature along with all the possible adverse reactions/side effects for all the Medicines you take and that have been prescribed to you. Take any new Medicines after you have completely understood and accpet all the possible adverse reactions/side effects.   Do not drive when taking Pain medications or sleeping medications (Benzodaizepines)  Do not take more than prescribed Pain, Sleep and Anxiety Medications. It is not advisable to combine anxiety,sleep and pain medications without talking with your primary care practitioner  Special Instructions: If you have smoked or chewed Tobacco  in the last 2 yrs please stop smoking, stop any regular Alcohol  and or any Recreational drug use.  Wear Seat belts while driving.  Please note: You were cared for by a hospitalist during your hospital stay. Once you are discharged, your primary care physician will handle any further medical issues. Please note that NO REFILLS for any discharge medications will be authorized once you are discharged, as it is imperative that you return to your primary care physician (or establish a relationship with a primary care physician if you do not have one) for your post hospital discharge needs so that they can reassess your need for medications and monitor your lab values.     Seizure precautions: Per  [place]  DMV statutes, patients with seizures are not allowed to drive until they have been seizure-free for six months and cleared by a physician    Use caution when using heavy equipment or power tools. Avoid working on ladders or at heights. Take showers instead of baths. Ensure the water temperature is not too high on the home water heater. Do not go swimming alone. Do not lock yourself in a room alone (i.e. bathroom). When caring for infants or small children, sit down when holding, feeding, or changing them to minimize risk of injury to the child in the event you have a seizure. Maintain good sleep hygiene. Avoid alcohol.    If patient has another seizure, call 911 and bring them back to the ED if: A.  The seizure lasts longer than 5 minutes.      B.  The patient doesn't wake shortly after the seizure or has new problems such as difficulty seeing, speaking or moving following the seizure C.  The patient was injured during the seizure D.  The patient has a temperature over 102 F (39C) E.  The patient vomited during the seizure and now is having trouble breathing    During the Seizure   - First, ensure adequate ventilation and place patients on the floor on their left side  Loosen clothing around the neck and ensure the airway is patent. If the  patient is clenching the teeth, do not force the mouth open with any object as this can cause severe damage - Remove all items from the surrounding that can be hazardous. The patient may be oblivious to what's happening and may not even know what he or she is doing. If the patient is confused and wandering, either gently guide him/her away and block access to outside areas - Reassure the individual and be comforting - Call 911. In most cases, the seizure ends before EMS arrives. However, there are cases when seizures may last over 3 to 5 minutes. Or the individual may have developed breathing difficulties or severe injuries. If a pregnant patient  or a person with diabetes develops a seizure, it is prudent to call an ambulance. - Finally, if the patient does not regain full consciousness, then call EMS. Most patients will remain confused for about 45 to 90 minutes after a seizure, so you must use judgment in calling for help. - Avoid restraints but make sure the patient is in a bed with padded side rails - Place the individual in a lateral position with the neck slightly flexed; this will help the saliva drain from the mouth and prevent the tongue from falling backward - Remove all nearby furniture and other hazards from the area - Provide verbal assurance as the individual is regaining consciousness - Provide the patient with privacy if possible - Call for help and start treatment as ordered by the caregiver    After the Seizure (Postictal Stage)   After a seizure, most patients experience confusion, fatigue, muscle pain and/or a headache. Thus, one should permit the individual to sleep. For the next few days, reassurance is essential. Being calm and helping reorient the person is also of importance.   Most seizures are painless and end spontaneously. Seizures are not harmful to others but can lead to complications such as stress on the lungs, brain and the heart. Individuals with prior lung problems may develop labored breathing and respiratory distress.    Increase activity slowly   Complete by: As directed       Allergies as of 09/13/2023       Reactions   Latex         Medication List     TAKE these medications    Emgality 120 MG/ML Soaj Generic drug: Galcanezumab-gnlm Inject 120 mg into the skin every 30 (thirty) days.   fluticasone  50 MCG/ACT nasal spray Commonly known as: FLONASE  Place 2 sprays into both nostrils daily. Start taking on: September 14, 2023   loratadine  10 MG tablet Commonly known as: Claritin  Take 1 tablet (10 mg total) by mouth daily.   LORazepam  0.5 MG tablet Commonly known as: ATIVAN  Take  0.5-1 mg by mouth every 6 (six) hours as needed for anxiety (seizure-like activity).   NONFORMULARY OR COMPOUNDED ITEM Compounded T3/T4 thyroid hormone therapy. Dosage unknown. Prescribed by Dr Mliss Height with [facility] in [facility]   OVER THE COUNTER MEDICATION Take 1 tablet by mouth daily. Caffeine + Vitamin supplement (300mg  of caffeine / tablet, vitamins unknown)   rizatriptan 10 MG tablet Commonly known as: MAXALT Take 10 mg by mouth as needed for migraine.   topiramate  100 MG tablet Commonly known as: TOPAMAX  Take 100 mg in a.m. and 150 mg in p.m. What changed:  how much to take how to take this when to take this additional instructions Another medication with the same name was removed. Continue taking this medication, and follow the  directions you see here.        Follow-up Information     Selmer Gilmer HERO, MD. Schedule an appointment as soon as possible for a visit in 1 week(s).   Specialty: Family Medicine        [facility] Follow up.   Specialty: Neurology Why: Hospital follow up, Office will call with date/time, If you dont hear from them,please give them a call Contact information: [address] [place]  [address] [phone]               Allergies  Allergen Reactions   Latex      Other Procedures/Studies: EEG adult Result Date: 09/13/2023 Shelton Arlin KIDD, MD     09/13/2023  8:37 AM Patient Name: Brittany Rogers MRN: [id number] Epilepsy Attending: Arlin KIDD Shelton Referring Physician/Provider: Seena Marsa NOVAK, MD Date: 09/13/2023 Duration: 24.39 mins Patient history: 48 y.o. female with medical history significant of hypothyroidism, migraines, hernia presenting with headache and seizure. EEG to evaluate for seizure Level of alertness: Awake, asleep AEDs during EEG study: LEV Technical aspects: This EEG study was done with scalp electrodes positioned according to the 10-20 International system  of electrode placement. Electrical activity was reviewed with band pass filter of 1-70Hz , sensitivity of 7 uV/mm, display speed of 75mm/sec with a 60Hz  notched filter applied as appropriate. EEG data were recorded continuously and digitally stored.  Video monitoring was available and reviewed as appropriate. Description: The posterior dominant rhythm consists of 9-10 Hz activity of moderate voltage (25-35 uV) seen predominantly in posterior head regions, symmetric and reactive to eye opening and eye closing. Sleep was characterized by vertex waves, sleep spindles (12 to 14 Hz), maximal frontocentral region.  There is continuous 3 to 6 Hz theta-delta slowing in left parieto-occipital region. Hyperventilation and photic stimulation were not performed.   ABNORMALITY - Continuous slow, left parieto-occipital region IMPRESSION: This study is suggestive of cortical dysfunction arising from left parieto-occipital region likely secondary to underlying structural abnormality. No seizures or epileptiform discharges were seen throughout the recording. Arlin KIDD Shelton   MR BRAIN W WO CONTRAST Result Date: 09/12/2023 CLINICAL DATA:  Initial evaluation for new onset seizure, history of left hemispheric lesion, status post biopsy. EXAM: MRI HEAD WITHOUT AND WITH CONTRAST MR VENOGRAM HEAD WITHOUT AND WITH CONTRAST TECHNIQUE: Multiplanar, multi-echo pulse sequences of the brain and surrounding structures were acquired without and with intravenous contrast. Angiographic images of the intracranial venous structures were acquired using MRV technique without and with intravenous contrast. CONTRAST:  5mL GADAVIST  GADOBUTROL  1 MMOL/ML IV SOLN COMPARISON:  CT from earlier the same day. FINDINGS: MRI HEAD WITHOUT AND WITH CONTRAST Brain: Cerebral volume within normal limits. Scattered patchy T2/FLAIR hyperintensity involving the periventricular and deep white matter both cerebral hemispheres, most characteristic of chronic microvascular  ischemic disease, moderate in nature. Patchy involvement of the pons noted. No abnormal foci of restricted diffusion to suggest acute or subacute ischemia. No areas of chronic cortical infarction. No acute intracranial hemorrhage. Focus of abnormal FLAIR signal abnormality measuring up to 3.3 cm involving the cortical to subcortical posterior left frontotemporal region (series 17, image 31). Cystic/bubbly appearance noted on T2 weighted sequence. Associated susceptibility artifact compatible with a small amount of chronic blood products, possibly related to prior biopsy. Finding is nonspecific. No significant surrounding edema or regional mass effect. No associated enhancement. No other mass lesion, midline shift or mass effect. No hydrocephalus or extra-axial fluid collection. Pituitary gland and suprasellar region  within normal limits. No intrinsic temporal lobe abnormality. No other abnormal enhancement. Vascular: Major intracranial vascular flow voids are maintained. Skull and upper cervical spine: Craniocervical junction within normal limits. Bone marrow signal intensity overall within normal limits. No scalp soft tissue abnormality. Sinuses/Orbits: Globes and orbital soft tissues within normal limits. Paranasal sinuses are largely clear. No significant mastoid effusion. Other: None. MR VENOGRAM HEAD WITHOUT AND WITH CONTRAST Normal flow related signal and enhancement seen throughout the superior sagittal sinus to the torcula. Transverse and sigmoid sinuses are patent as are the jugular bulbs and visualized proximal internal jugular veins. Straight sinus, vein of Galen, and internal cerebral veins are patent. No evidence for dural venous sinus thrombosis. No appreciable cortical vein abnormality. IMPRESSION: MRI HEAD: 1. No acute intracranial abnormality. 2. 3.3 cm focus of FLAIR hyperintense lesion involving the cortical to subcortical posterior left frontotemporal region, indeterminate. Findings are  nonspecific, with primary differential considerations including a low-grade glial neoplasm or possibly cortical dysplasia. Correlation with prior pathology recommended. No associated enhancement or significant regional mass effect. 3. Underlying moderate chronic microvascular ischemic disease. MRV HEAD: Normal intracranial MRV. No evidence for dural venous sinus thrombosis. Electronically Signed   By: Morene Hoard M.D.   On: 09/12/2023 21:45   MR MRV HEAD W WO CONTRAST Result Date: 09/12/2023 CLINICAL DATA:  Initial evaluation for new onset seizure, history of left hemispheric lesion, status post biopsy. EXAM: MRI HEAD WITHOUT AND WITH CONTRAST MR VENOGRAM HEAD WITHOUT AND WITH CONTRAST TECHNIQUE: Multiplanar, multi-echo pulse sequences of the brain and surrounding structures were acquired without and with intravenous contrast. Angiographic images of the intracranial venous structures were acquired using MRV technique without and with intravenous contrast. CONTRAST:  5mL GADAVIST  GADOBUTROL  1 MMOL/ML IV SOLN COMPARISON:  CT from earlier the same day. FINDINGS: MRI HEAD WITHOUT AND WITH CONTRAST Brain: Cerebral volume within normal limits. Scattered patchy T2/FLAIR hyperintensity involving the periventricular and deep white matter both cerebral hemispheres, most characteristic of chronic microvascular ischemic disease, moderate in nature. Patchy involvement of the pons noted. No abnormal foci of restricted diffusion to suggest acute or subacute ischemia. No areas of chronic cortical infarction. No acute intracranial hemorrhage. Focus of abnormal FLAIR signal abnormality measuring up to 3.3 cm involving the cortical to subcortical posterior left frontotemporal region (series 17, image 31). Cystic/bubbly appearance noted on T2 weighted sequence. Associated susceptibility artifact compatible with a small amount of chronic blood products, possibly related to prior biopsy. Finding is nonspecific. No significant  surrounding edema or regional mass effect. No associated enhancement. No other mass lesion, midline shift or mass effect. No hydrocephalus or extra-axial fluid collection. Pituitary gland and suprasellar region within normal limits. No intrinsic temporal lobe abnormality. No other abnormal enhancement. Vascular: Major intracranial vascular flow voids are maintained. Skull and upper cervical spine: Craniocervical junction within normal limits. Bone marrow signal intensity overall within normal limits. No scalp soft tissue abnormality. Sinuses/Orbits: Globes and orbital soft tissues within normal limits. Paranasal sinuses are largely clear. No significant mastoid effusion. Other: None. MR VENOGRAM HEAD WITHOUT AND WITH CONTRAST Normal flow related signal and enhancement seen throughout the superior sagittal sinus to the torcula. Transverse and sigmoid sinuses are patent as are the jugular bulbs and visualized proximal internal jugular veins. Straight sinus, vein of Galen, and internal cerebral veins are patent. No evidence for dural venous sinus thrombosis. No appreciable cortical vein abnormality. IMPRESSION: MRI HEAD: 1. No acute intracranial abnormality. 2. 3.3 cm focus of FLAIR hyperintense lesion involving the cortical  to subcortical posterior left frontotemporal region, indeterminate. Findings are nonspecific, with primary differential considerations including a low-grade glial neoplasm or possibly cortical dysplasia. Correlation with prior pathology recommended. No associated enhancement or significant regional mass effect. 3. Underlying moderate chronic microvascular ischemic disease. MRV HEAD: Normal intracranial MRV. No evidence for dural venous sinus thrombosis. Electronically Signed   By: Morene Hoard M.D.   On: 09/12/2023 21:45   DG Chest Portable 1 View Result Date: 09/12/2023 CLINICAL DATA:  Migraine, seizure, weakness, and difficulty speaking. EXAM: PORTABLE CHEST 1 VIEW COMPARISON:  None  Available. FINDINGS: The heart is normal in size and the mediastinal contour is within normal limits. Mild airspace opacities are seen at the lung bases. No effusion or pneumothorax is seen. No acute osseous abnormality. IMPRESSION: Mild airspace disease at the lung bases, possible atelectasis or infiltrate. Electronically Signed   By: Leita Birmingham M.D.   On: 09/12/2023 15:39   CT Head Wo Contrast Result Date: 09/12/2023 CLINICAL DATA:  Seizure, new-onset, no history of trauma new seizure/ ha EXAM: CT HEAD WITHOUT CONTRAST TECHNIQUE: Contiguous axial images were obtained from the base of the skull through the vertex without intravenous contrast. RADIATION DOSE REDUCTION: This exam was performed according to the departmental dose-optimization program which includes automated exposure control, adjustment of the mA and/or kV according to patient size and/or use of iterative reconstruction technique. COMPARISON:  None Available. FINDINGS: Brain: Patchy and confluent areas of decreased attenuation are noted throughout the deep and periventricular white matter of the cerebral hemispheres bilaterally, compatible with chronic microvascular ischemic disease. No evidence of large-territorial acute infarction. No parenchymal hemorrhage. No mass lesion. No extra-axial collection. No mass effect or midline shift. No hydrocephalus. Basilar cisterns are patent. Vascular: No hyperdense vessel. Skull: No acute fracture or focal lesion. Sinuses/Orbits: Paranasal sinuses and mastoid air cells are clear. The orbits are unremarkable. Other: None. IMPRESSION: No acute intracranial abnormality. Electronically Signed   By: Morgane  Naveau M.D.   On: 09/12/2023 14:56     TODAY-DAY OF DISCHARGE:  Subjective:   Brittany Rogers today has no headache,no chest abdominal pain,no new weakness tingling or numbness, feels much better wants to go home today.   Objective:   Blood pressure (!) 99/54, pulse 75, temperature 98 F (36.7 C),  temperature source Oral, resp. rate 17, height 5' 1 (1.549 m), weight 50.8 kg, SpO2 96%. No intake or output data in the 24 hours ending 09/13/23 1125 Filed Weights   09/12/23 1442  Weight: 50.8 kg    Exam: Awake Alert, Oriented *3, No new F.N deficits, Normal affect [place].AT,PERRAL Supple Neck,No JVD, No cervical lymphadenopathy appriciated.  Symmetrical Chest wall movement, Good air movement bilaterally, CTAB RRR,No Gallops,Rubs or new Murmurs, No Parasternal Heave +ve B.Sounds, Abd Soft, Non tender, No organomegaly appriciated, No rebound -guarding or rigidity. No Cyanosis, Clubbing or edema, No new Rash or bruise   PERTINENT RADIOLOGIC STUDIES: EEG adult Result Date: 09/13/2023 Shelton Arlin KIDD, MD     09/13/2023  8:37 AM Patient Name: Brittany Rogers MRN: [id number] Epilepsy Attending: Arlin KIDD Shelton Referring Physician/Provider: Seena Marsa NOVAK, MD Date: 09/13/2023 Duration: 24.39 mins Patient history: 48 y.o. female with medical history significant of hypothyroidism, migraines, hernia presenting with headache and seizure. EEG to evaluate for seizure Level of alertness: Awake, asleep AEDs during EEG study: LEV Technical aspects: This EEG study was done with scalp electrodes positioned according to the 10-20 International system of electrode placement. Electrical activity was reviewed with band pass filter of 1-70Hz ,  sensitivity of 7 uV/mm, display speed of 37mm/sec with a 60Hz  notched filter applied as appropriate. EEG data were recorded continuously and digitally stored.  Video monitoring was available and reviewed as appropriate. Description: The posterior dominant rhythm consists of 9-10 Hz activity of moderate voltage (25-35 uV) seen predominantly in posterior head regions, symmetric and reactive to eye opening and eye closing. Sleep was characterized by vertex waves, sleep spindles (12 to 14 Hz), maximal frontocentral region.  There is continuous 3 to 6 Hz theta-delta slowing in left  parieto-occipital region. Hyperventilation and photic stimulation were not performed.   ABNORMALITY - Continuous slow, left parieto-occipital region IMPRESSION: This study is suggestive of cortical dysfunction arising from left parieto-occipital region likely secondary to underlying structural abnormality. No seizures or epileptiform discharges were seen throughout the recording. Arlin MALVA Krebs   MR BRAIN W WO CONTRAST Result Date: 09/12/2023 CLINICAL DATA:  Initial evaluation for new onset seizure, history of left hemispheric lesion, status post biopsy. EXAM: MRI HEAD WITHOUT AND WITH CONTRAST MR VENOGRAM HEAD WITHOUT AND WITH CONTRAST TECHNIQUE: Multiplanar, multi-echo pulse sequences of the brain and surrounding structures were acquired without and with intravenous contrast. Angiographic images of the intracranial venous structures were acquired using MRV technique without and with intravenous contrast. CONTRAST:  5mL GADAVIST  GADOBUTROL  1 MMOL/ML IV SOLN COMPARISON:  CT from earlier the same day. FINDINGS: MRI HEAD WITHOUT AND WITH CONTRAST Brain: Cerebral volume within normal limits. Scattered patchy T2/FLAIR hyperintensity involving the periventricular and deep white matter both cerebral hemispheres, most characteristic of chronic microvascular ischemic disease, moderate in nature. Patchy involvement of the pons noted. No abnormal foci of restricted diffusion to suggest acute or subacute ischemia. No areas of chronic cortical infarction. No acute intracranial hemorrhage. Focus of abnormal FLAIR signal abnormality measuring up to 3.3 cm involving the cortical to subcortical posterior left frontotemporal region (series 17, image 31). Cystic/bubbly appearance noted on T2 weighted sequence. Associated susceptibility artifact compatible with a small amount of chronic blood products, possibly related to prior biopsy. Finding is nonspecific. No significant surrounding edema or regional mass effect. No associated  enhancement. No other mass lesion, midline shift or mass effect. No hydrocephalus or extra-axial fluid collection. Pituitary gland and suprasellar region within normal limits. No intrinsic temporal lobe abnormality. No other abnormal enhancement. Vascular: Major intracranial vascular flow voids are maintained. Skull and upper cervical spine: Craniocervical junction within normal limits. Bone marrow signal intensity overall within normal limits. No scalp soft tissue abnormality. Sinuses/Orbits: Globes and orbital soft tissues within normal limits. Paranasal sinuses are largely clear. No significant mastoid effusion. Other: None. MR VENOGRAM HEAD WITHOUT AND WITH CONTRAST Normal flow related signal and enhancement seen throughout the superior sagittal sinus to the torcula. Transverse and sigmoid sinuses are patent as are the jugular bulbs and visualized proximal internal jugular veins. Straight sinus, vein of Galen, and internal cerebral veins are patent. No evidence for dural venous sinus thrombosis. No appreciable cortical vein abnormality. IMPRESSION: MRI HEAD: 1. No acute intracranial abnormality. 2. 3.3 cm focus of FLAIR hyperintense lesion involving the cortical to subcortical posterior left frontotemporal region, indeterminate. Findings are nonspecific, with primary differential considerations including a low-grade glial neoplasm or possibly cortical dysplasia. Correlation with prior pathology recommended. No associated enhancement or significant regional mass effect. 3. Underlying moderate chronic microvascular ischemic disease. MRV HEAD: Normal intracranial MRV. No evidence for dural venous sinus thrombosis. Electronically Signed   By: Morene Hoard M.D.   On: 09/12/2023 21:45   MR MRV HEAD  W WO CONTRAST Result Date: 09/12/2023 CLINICAL DATA:  Initial evaluation for new onset seizure, history of left hemispheric lesion, status post biopsy. EXAM: MRI HEAD WITHOUT AND WITH CONTRAST MR VENOGRAM HEAD  WITHOUT AND WITH CONTRAST TECHNIQUE: Multiplanar, multi-echo pulse sequences of the brain and surrounding structures were acquired without and with intravenous contrast. Angiographic images of the intracranial venous structures were acquired using MRV technique without and with intravenous contrast. CONTRAST:  5mL GADAVIST  GADOBUTROL  1 MMOL/ML IV SOLN COMPARISON:  CT from earlier the same day. FINDINGS: MRI HEAD WITHOUT AND WITH CONTRAST Brain: Cerebral volume within normal limits. Scattered patchy T2/FLAIR hyperintensity involving the periventricular and deep white matter both cerebral hemispheres, most characteristic of chronic microvascular ischemic disease, moderate in nature. Patchy involvement of the pons noted. No abnormal foci of restricted diffusion to suggest acute or subacute ischemia. No areas of chronic cortical infarction. No acute intracranial hemorrhage. Focus of abnormal FLAIR signal abnormality measuring up to 3.3 cm involving the cortical to subcortical posterior left frontotemporal region (series 17, image 31). Cystic/bubbly appearance noted on T2 weighted sequence. Associated susceptibility artifact compatible with a small amount of chronic blood products, possibly related to prior biopsy. Finding is nonspecific. No significant surrounding edema or regional mass effect. No associated enhancement. No other mass lesion, midline shift or mass effect. No hydrocephalus or extra-axial fluid collection. Pituitary gland and suprasellar region within normal limits. No intrinsic temporal lobe abnormality. No other abnormal enhancement. Vascular: Major intracranial vascular flow voids are maintained. Skull and upper cervical spine: Craniocervical junction within normal limits. Bone marrow signal intensity overall within normal limits. No scalp soft tissue abnormality. Sinuses/Orbits: Globes and orbital soft tissues within normal limits. Paranasal sinuses are largely clear. No significant mastoid effusion.  Other: None. MR VENOGRAM HEAD WITHOUT AND WITH CONTRAST Normal flow related signal and enhancement seen throughout the superior sagittal sinus to the torcula. Transverse and sigmoid sinuses are patent as are the jugular bulbs and visualized proximal internal jugular veins. Straight sinus, vein of Galen, and internal cerebral veins are patent. No evidence for dural venous sinus thrombosis. No appreciable cortical vein abnormality. IMPRESSION: MRI HEAD: 1. No acute intracranial abnormality. 2. 3.3 cm focus of FLAIR hyperintense lesion involving the cortical to subcortical posterior left frontotemporal region, indeterminate. Findings are nonspecific, with primary differential considerations including a low-grade glial neoplasm or possibly cortical dysplasia. Correlation with prior pathology recommended. No associated enhancement or significant regional mass effect. 3. Underlying moderate chronic microvascular ischemic disease. MRV HEAD: Normal intracranial MRV. No evidence for dural venous sinus thrombosis. Electronically Signed   By: Morene Hoard M.D.   On: 09/12/2023 21:45   DG Chest Portable 1 View Result Date: 09/12/2023 CLINICAL DATA:  Migraine, seizure, weakness, and difficulty speaking. EXAM: PORTABLE CHEST 1 VIEW COMPARISON:  None Available. FINDINGS: The heart is normal in size and the mediastinal contour is within normal limits. Mild airspace opacities are seen at the lung bases. No effusion or pneumothorax is seen. No acute osseous abnormality. IMPRESSION: Mild airspace disease at the lung bases, possible atelectasis or infiltrate. Electronically Signed   By: Leita Birmingham M.D.   On: 09/12/2023 15:39   CT Head Wo Contrast Result Date: 09/12/2023 CLINICAL DATA:  Seizure, new-onset, no history of trauma new seizure/ ha EXAM: CT HEAD WITHOUT CONTRAST TECHNIQUE: Contiguous axial images were obtained from the base of the skull through the vertex without intravenous contrast. RADIATION DOSE  REDUCTION: This exam was performed according to the departmental dose-optimization program which includes automated  exposure control, adjustment of the mA and/or kV according to patient size and/or use of iterative reconstruction technique. COMPARISON:  None Available. FINDINGS: Brain: Patchy and confluent areas of decreased attenuation are noted throughout the deep and periventricular white matter of the cerebral hemispheres bilaterally, compatible with chronic microvascular ischemic disease. No evidence of large-territorial acute infarction. No parenchymal hemorrhage. No mass lesion. No extra-axial collection. No mass effect or midline shift. No hydrocephalus. Basilar cisterns are patent. Vascular: No hyperdense vessel. Skull: No acute fracture or focal lesion. Sinuses/Orbits: Paranasal sinuses and mastoid air cells are clear. The orbits are unremarkable. Other: None. IMPRESSION: No acute intracranial abnormality. Electronically Signed   By: Morgane  Naveau M.D.   On: 09/12/2023 14:56     PERTINENT LAB RESULTS: CBC: Recent Labs    09/12/23 1352 09/12/23 1356 09/13/23 0557  WBC 3.3*  --  7.7  HGB 14.5 15.0 14.4  HCT 44.2 44.0 42.2  PLT 169  --  175   CMET CMP     Component Value Date/Time   NA 143 09/13/2023 0557   K 4.2 09/13/2023 0557   CL 113 (H) 09/13/2023 0557   CO2 20 (L) 09/13/2023 0557   GLUCOSE 111 (H) 09/13/2023 0557   BUN 15 09/13/2023 0557   CREATININE 1.00 09/13/2023 0557   CALCIUM 8.9 09/13/2023 0557   PROT 5.6 (L) 09/13/2023 0557   ALBUMIN 3.2 (L) 09/13/2023 0557   AST 23 09/13/2023 0557   ALT 28 09/13/2023 0557   ALKPHOS 40 09/13/2023 0557   BILITOT 1.1 09/13/2023 0557   GFRNONAA >60 09/13/2023 0557    GFR Estimated Creatinine Clearance: 52.5 mL/min (by C-G formula based on SCr of 1 mg/dL). No results for input(s): LIPASE, AMYLASE in the last 72 hours. Recent Labs    09/12/23 1447  CKTOTAL 152   Invalid input(s): POCBNP No results for input(s):  DDIMER in the last 72 hours. No results for input(s): HGBA1C in the last 72 hours. No results for input(s): CHOL, HDL, LDLCALC, TRIG, CHOLHDL, LDLDIRECT in the last 72 hours. Recent Labs    09/12/23 1447  TSH 0.788   No results for input(s): VITAMINB12, FOLATE, FERRITIN, TIBC, IRON, RETICCTPCT in the last 72 hours. Coags: No results for input(s): INR in the last 72 hours.  Invalid input(s): PT Microbiology: No results found for this or any previous visit (from the past 240 hours).  FURTHER DISCHARGE INSTRUCTIONS:  Get Medicines reviewed and adjusted: Please take all your medications with you for your next visit with your Primary MD  Laboratory/radiological data: Please request your Primary MD to go over all hospital tests and procedure/radiological results at the follow up, please ask your Primary MD to get all Hospital records sent to his/her office.  In some cases, they will be blood work, cultures and biopsy results pending at the time of your discharge. Please request that your primary care M.D. goes through all the records of your hospital data and follows up on these results.  Also Note the following: If you experience worsening of your admission symptoms, develop shortness of breath, life threatening emergency, suicidal or homicidal thoughts you must seek medical attention immediately by calling 911 or calling your MD immediately  if symptoms less severe.  You must read complete instructions/literature along with all the possible adverse reactions/side effects for all the Medicines you take and that have been prescribed to you. Take any new Medicines after you have completely understood and accpet all the possible adverse reactions/side effects.  Do not drive when taking Pain medications or sleeping medications (Benzodaizepines)  Do not take more than prescribed Pain, Sleep and Anxiety Medications. It is not advisable to combine anxiety,sleep  and pain medications without talking with your primary care practitioner  Special Instructions: If you have smoked or chewed Tobacco  in the last 2 yrs please stop smoking, stop any regular Alcohol  and or any Recreational drug use.  Wear Seat belts while driving.  Please note: You were cared for by a hospitalist during your hospital stay. Once you are discharged, your primary care physician will handle any further medical issues. Please note that NO REFILLS for any discharge medications will be authorized once you are discharged, as it is imperative that you return to your primary care physician (or establish a relationship with a primary care physician if you do not have one) for your post hospital discharge needs so that they can reassess your need for medications and monitor your lab values.  Total Time spent coordinating discharge including counseling, education and face to face time equals greater than 30 minutes.  SignedBETHA Donalda Applebaum 09/13/2023 11:25 AM

## 2023-09-13 NOTE — Procedures (Signed)
 Patient Name: Armandina Iman  MRN: [id number]  Epilepsy Attending: Arlin MALVA Krebs  Referring Physician/Provider: Seena Marsa NOVAK, MD  Date: 09/13/2023 Duration: 24.39 mins  Patient history: 48 y.o. female with medical history significant of hypothyroidism, migraines, hernia presenting with headache and seizure. EEG to evaluate for seizure  Level of alertness: Awake, asleep  AEDs during EEG study: LEV  Technical aspects: This EEG study was done with scalp electrodes positioned according to the 10-20 International system of electrode placement. Electrical activity was reviewed with band pass filter of 1-70Hz , sensitivity of 7 uV/mm, display speed of 60mm/sec with a 60Hz  notched filter applied as appropriate. EEG data were recorded continuously and digitally stored.  Video monitoring was available and reviewed as appropriate.  Description: The posterior dominant rhythm consists of 9-10 Hz activity of moderate voltage (25-35 uV) seen predominantly in posterior head regions, symmetric and reactive to eye opening and eye closing. Sleep was characterized by vertex waves, sleep spindles (12 to 14 Hz), maximal frontocentral region.  There is continuous 3 to 6 Hz theta-delta slowing in left parieto-occipital region. Hyperventilation and photic stimulation were not performed.     ABNORMALITY - Continuous slow, left parieto-occipital region  IMPRESSION: This study is suggestive of cortical dysfunction arising from left parieto-occipital region likely secondary to underlying structural abnormality. No seizures or epileptiform discharges were seen throughout the recording.  Ebunoluwa Gernert O Sheetal Lyall

## 2023-09-13 NOTE — Plan of Care (Signed)

## 2023-09-13 NOTE — Progress Notes (Signed)
 EEG complete - results pending

## 2023-09-13 NOTE — Discharge Instructions (Signed)
Seizure precautions: °Per [place] DMV statutes, patients with seizures are not allowed to drive until they have been seizure-free for six months and cleared by a physician  °  °Use caution when using heavy equipment or power tools. Avoid working on ladders or at heights. Take showers instead of baths. Ensure the water temperature is not too high on the home water heater. Do not go swimming alone. Do not lock yourself in a room alone (i.e. bathroom). When caring for infants or small children, sit down when holding, feeding, or changing them to minimize risk of injury to the child in the event you have a seizure. Maintain good sleep hygiene. Avoid alcohol.  °  °If patient has another seizure, call 911 and bring them back to the ED if: °A.  The seizure lasts longer than 5 minutes.      °B.  The patient doesn't wake shortly after the seizure or has new problems such as difficulty seeing, speaking or moving following the seizure °C.  The patient was injured during the seizure °D.  The patient has a temperature over 102 F (39C) °E.  The patient vomited during the seizure and now is having trouble breathing °   °During the Seizure °  °- First, ensure adequate ventilation and place patients on the floor on their left side  °Loosen clothing around the neck and ensure the airway is patent. If the patient is clenching the teeth, do not force the mouth open with any object as this can cause severe damage °- Remove all items from the surrounding that can be hazardous. The patient may be oblivious to what's happening and may not even know what he or she is doing. °If the patient is confused and wandering, either gently guide him/her away and block access to outside areas °- Reassure the individual and be comforting °- Call 911. In most cases, the seizure ends before EMS arrives. However, there are cases when seizures may last over 3 to 5 minutes. Or the individual may have developed breathing difficulties or severe  injuries. If a pregnant patient or a person with diabetes develops a seizure, it is prudent to call an ambulance. °- Finally, if the patient does not regain full consciousness, then call EMS. Most patients will remain confused for about 45 to 90 minutes after a seizure, so you must use judgment in calling for help. °- Avoid restraints but make sure the patient is in a bed with padded side rails °- Place the individual in a lateral position with the neck slightly flexed; this will help the saliva drain from the mouth and prevent the tongue from falling backward °- Remove all nearby furniture and other hazards from the area °- Provide verbal assurance as the individual is regaining consciousness °- Provide the patient with privacy if possible °- Call for help and start treatment as ordered by the caregiver °  ° After the Seizure (Postictal Stage) °  °After a seizure, most patients experience confusion, fatigue, muscle pain and/or a headache. Thus, one should permit the individual to sleep. For the next few days, reassurance is essential. Being calm and helping reorient the person is also of importance. °  °Most seizures are painless and end spontaneously. Seizures are not harmful to others but can lead to complications such as stress on the lungs, brain and the heart. Individuals with prior lung problems may develop labored breathing and respiratory distress.  °  °

## 2023-09-15 ENCOUNTER — Telehealth: Admitting: Family Medicine

## 2023-09-15 ENCOUNTER — Ambulatory Visit: Payer: Self-pay

## 2023-09-15 DIAGNOSIS — R3989 Other symptoms and signs involving the genitourinary system: Secondary | ICD-10-CM | POA: Diagnosis not present

## 2023-09-15 MED ORDER — CEPHALEXIN 500 MG PO CAPS: 500.0000 mg | ORAL_CAPSULE | Freq: Two times a day (BID) | ORAL | 0 refills | Status: AC

## 2023-09-15 NOTE — Telephone Encounter (Signed)
 FYI Only or Action Required?: FYI only for provider.  Patient was last seen in primary care on not a patient.  Called Nurse Triage reporting Urinary Frequency.  Copied from CRM #[id number]. Topic: Clinical - Medical Advice >> Sep 15, 2023 10:18 AM Myrick T wrote: Reason for CRM: patient was discharged from hospital on Monday but while there she had a catheter and now she is experiencing symptoms on an UTI. Please f/u with patient.   Called and spoke to patient: patient stated after she called us  this morning she called another place and they are in the process of sending a Rx for the patient: therefore pt stated she does not need to be triaged anymore.  Nurse informed pt to us  back if/when nedded.

## 2023-09-15 NOTE — Progress Notes (Signed)

## 2023-09-17 ENCOUNTER — Encounter (HOSPITAL_COMMUNITY): Payer: Self-pay | Admitting: Family Medicine

## 2023-09-17 NOTE — Progress Notes (Signed)
 Today at this time, Patients husband stopped by [facility] requesting EEG data for his wife to add to the collection of MRI, CT, and other radiology reports for his Dr. Appointment set soon at [facility] (appointment set for 12/30/23, Dr. Gregg) as well as Dr. Gilmer Elders, MD in [place]  who has been following his wife the patient. We Informed Mr. Magner that we were only able to provide a Neurology report of the EEG reading and interpretation. We explained that we are not able to burn a CD EEG-to-go and he would need a medical release signed by his wife through Medical Records Department. Pt states he was told by Medical Records that he could visit each department to retrieve CD burned images of the requested data since signing the medical release. GMD, MB assisted and called in NP Elston Last to assist with his needs. Both Technologist confirmed by calling his wife Shallen Luedke and spoke to her directly to confirm the release of her medical information as well as verifying Medical release and the evidence of all other MRI/CT x-ray and other radiology results that were collected in hand by Mr. Mooers. We provided only the Neurology EEG report read and interpreted by Dr. Shelton on September 13, 2023 at approximately  8:30 am. Pt was instructed to follow up and make the [facility] appointment in December and the Dr. And staff would have access to all information that we have and they could also send any further information needed through them for future reference.

## 2023-09-21 ENCOUNTER — Encounter: Payer: Self-pay | Admitting: Neurology

## 2023-10-21 ENCOUNTER — Emergency Department (HOSPITAL_COMMUNITY): Admission: EM | Admit: 2023-10-21 | Discharge: 2023-10-21 | Disposition: A | Discharge status: Home / Self Care

## 2023-10-21 ENCOUNTER — Other Ambulatory Visit: Payer: Self-pay

## 2023-10-21 ENCOUNTER — Emergency Department (HOSPITAL_COMMUNITY)

## 2023-10-21 ENCOUNTER — Ambulatory Visit: Admitting: Neurology

## 2023-10-21 DIAGNOSIS — R4182 Altered mental status, unspecified: Secondary | ICD-10-CM | POA: Insufficient documentation

## 2023-10-21 DIAGNOSIS — R569 Unspecified convulsions: Secondary | ICD-10-CM | POA: Insufficient documentation

## 2023-10-21 LAB — COMPREHENSIVE METABOLIC PANEL WITH GFR
ALT: 28 U/L (ref 0–44)
AST: 32 U/L (ref 15–41)
Albumin: 4.1 g/dL (ref 3.5–5.0)
Alkaline Phosphatase: 55 U/L (ref 38–126)
Anion gap: 19 — ABNORMAL HIGH (ref 5–15)
BUN: 24 mg/dL — ABNORMAL HIGH (ref 6–20)
CO2: 13 mmol/L — ABNORMAL LOW (ref 22–32)
Calcium: 8.7 mg/dL — ABNORMAL LOW (ref 8.9–10.3)
Chloride: 108 mmol/L (ref 98–111)
Creatinine, Ser: 1.19 mg/dL — ABNORMAL HIGH (ref 0.44–1.00)
GFR, Estimated: 57 mL/min — ABNORMAL LOW (ref >60)
Glucose, Bld: 165 mg/dL — ABNORMAL HIGH (ref 70–99)
Potassium: 3.2 mmol/L — ABNORMAL LOW (ref 3.5–5.1)
Sodium: 140 mmol/L (ref 135–145)
Total Bilirubin: 0.7 mg/dL (ref 0.0–1.2)
Total Protein: 7.1 g/dL (ref 6.5–8.1)

## 2023-10-21 LAB — AMMONIA: Ammonia: <13 umol/L (ref 9–35)

## 2023-10-21 LAB — PREGNANCY, URINE: Preg Test, Ur: NEGATIVE

## 2023-10-21 LAB — URINALYSIS, ROUTINE W REFLEX MICROSCOPIC
Bilirubin Urine: NEGATIVE
Glucose, UA: NEGATIVE mg/dL
Hgb urine dipstick: NEGATIVE
Ketones, ur: NEGATIVE mg/dL
Leukocytes,Ua: NEGATIVE
Nitrite: NEGATIVE
Protein, ur: NEGATIVE mg/dL
Specific Gravity, Urine: 1.009 (ref 1.005–1.030)
pH: 6 (ref 5.0–8.0)

## 2023-10-21 LAB — CBC WITH DIFFERENTIAL/PLATELET
Abs Immature Granulocytes: 0.05 K/uL (ref 0.00–0.07)
Basophils Absolute: 0.1 K/uL (ref 0.0–0.1)
Basophils Relative: 1 %
Eosinophils Absolute: 0 K/uL (ref 0.0–0.5)
Eosinophils Relative: 0 %
HCT: 48 % — ABNORMAL HIGH (ref 36.0–46.0)
Hemoglobin: 16.2 g/dL — ABNORMAL HIGH (ref 12.0–15.0)
Immature Granulocytes: 1 %
Lymphocytes Relative: 17 %
Lymphs Abs: 1.2 K/uL (ref 0.7–4.0)
MCH: 31.5 pg (ref 26.0–34.0)
MCHC: 33.8 g/dL (ref 30.0–36.0)
MCV: 93.2 fL (ref 80.0–100.0)
Monocytes Absolute: 0.3 K/uL (ref 0.1–1.0)
Monocytes Relative: 5 %
Neutro Abs: 5.3 K/uL (ref 1.7–7.7)
Neutrophils Relative %: 76 %
Platelets: 217 K/uL (ref 150–400)
RBC: 5.15 MIL/uL — ABNORMAL HIGH (ref 3.87–5.11)
RDW: 12.6 % (ref 11.5–15.5)
WBC: 7 K/uL (ref 4.0–10.5)
nRBC: 0 % (ref 0.0–0.2)

## 2023-10-21 LAB — RAPID URINE DRUG SCREEN, HOSP PERFORMED
Amphetamines: NOT DETECTED
Barbiturates: NOT DETECTED
Benzodiazepines: NOT DETECTED
Cocaine: NOT DETECTED
Opiates: NOT DETECTED
Tetrahydrocannabinol: NOT DETECTED

## 2023-10-21 MED ORDER — LEVETIRACETAM (KEPPRA) 500 MG/5 ML ADULT IV PUSH
1500.0000 mg | Freq: Once | INTRAVENOUS | Status: DC
  Filled 2023-10-21: qty 15

## 2023-10-21 MED ORDER — DIPHENHYDRAMINE HCL 50 MG/ML IJ SOLN
25.0000 mg | Freq: Once | INTRAMUSCULAR | Status: DC
Start: 2023-10-21 — End: 2023-10-21

## 2023-10-21 MED ORDER — DEXAMETHASONE SOD PHOSPHATE PF 10 MG/ML IJ SOLN
10.0000 mg | Freq: Once | INTRAMUSCULAR | Status: AC
  Administered 2023-10-21: 10 mg via INTRAVENOUS

## 2023-10-21 MED ORDER — PROCHLORPERAZINE EDISYLATE 10 MG/2ML IJ SOLN
10.0000 mg | Freq: Once | INTRAMUSCULAR | Status: AC
  Administered 2023-10-21: 10 mg via INTRAVENOUS
  Filled 2023-10-21: qty 2

## 2023-10-21 MED ORDER — LACTATED RINGERS IV BOLUS
1000.0000 mL | Freq: Once | INTRAVENOUS | Status: AC
  Administered 2023-10-21: 1000 mL via INTRAVENOUS

## 2023-10-21 MED ORDER — KETOROLAC TROMETHAMINE 15 MG/ML IJ SOLN
15.0000 mg | Freq: Once | INTRAMUSCULAR | Status: AC
  Administered 2023-10-21: 15 mg via INTRAVENOUS
  Filled 2023-10-21: qty 1

## 2023-10-21 NOTE — ED Provider Notes (Signed)
 [place] EMERGENCY DEPARTMENT AT [facility] Provider Note   CSN: [id number] Arrival date & time: 10/21/23  1210     Patient presents with: Altered Mental Status   Brittany Rogers is a 48 y.o. female.   48 year old female with recent admission for seizure-like activity who was found to have reassuring workup presenting to the emergency department today with altered mental status.  The patient was at a job interview when she apparently started staring off into space.  She eventually left the interview early.  She was found hard to respond in her car.  She was brought to the ER at that time for further evaluation.  Medics reported that the patient initially was somewhat combative but is more cooperative now.  The patient is a little more alert now and denying any complaints currently.        Prior to Admission medications   Medication Sig Start Date End Date Taking? Authorizing Provider  diazepam (DIASTAT ACUDIAL) 10 MG GEL Place 10 mg rectally once as needed for seizure. 09/24/23  Yes [provider]  EMGALITY 120 MG/ML SOAJ Inject 120 mg into the skin every 30 (thirty) days. 12/20/19  Yes [provider]  LORazepam  (ATIVAN ) 0.5 MG tablet Take 0.5-1 mg by mouth every 6 (six) hours as needed for anxiety (seizure-like activity).   Yes [provider]  NONFORMULARY OR COMPOUNDED ITEM Compounded T3/T4 thyroid  hormone therapy. Dosage unknown. Prescribed by Dr Mliss Height with [facility] in [facility]   Yes [provider]  OVER THE COUNTER MEDICATION Take 1 tablet by mouth daily. Caffeine + Vitamin supplement (300mg  of caffeine / tablet, vitamins unknown)   Yes [provider]  rizatriptan (MAXALT) 10 MG tablet Take 10 mg by mouth as needed for migraine. 12/20/19  Yes [provider]  topiramate  (TOPAMAX ) 100 MG tablet Take 100 mg in a.m. and 150 mg in p.m. 09/13/23  Yes Ghimire, Donalda CHRISTELLA, MD    Allergies: Latex    Review of  Systems  Reason unable to perform ROS: AMS.    Updated Vital Signs BP 127/71   Pulse (!) 104   Temp 98.2 F (36.8 C) (Oral)   Resp (!) 24   SpO2 98%   Physical Exam Vitals and nursing note reviewed.   Gen: NAD Eyes: PERRL, EOMI HEENT: no oropharyngeal swelling Neck: trachea midline, no meningismus Resp: clear to auscultation bilaterally Card: RRR, no murmurs, rubs, or gallops Abd: nontender, nondistended Extremities: no calf tenderness, no edema Vascular: 2+ radial pulses bilaterally, 2+ DP pulses bilaterally Neuro: Somnolent but will arouse to verbal stimuli, Cns intact, no focal deficits, AAO X 3 Skin: no rashes Psyc: acting appropriately   (all labs ordered are listed, but only abnormal results are displayed) Labs Reviewed  CBC WITH DIFFERENTIAL/PLATELET - Abnormal; Notable for the following components:      Result Value   RBC 5.15 (*)    Hemoglobin 16.2 (*)    HCT 48.0 (*)    All other components within normal limits  COMPREHENSIVE METABOLIC PANEL WITH GFR - Abnormal; Notable for the following components:   Potassium 3.2 (*)    CO2 13 (*)    Glucose, Bld 165 (*)    BUN 24 (*)    Creatinine, Ser 1.19 (*)    Calcium 8.7 (*)    GFR, Estimated 57 (*)    Anion gap 19 (*)    All other components within normal limits  AMMONIA  URINALYSIS, ROUTINE W REFLEX  MICROSCOPIC  RAPID URINE DRUG SCREEN, HOSP PERFORMED  HCG, SERUM, QUALITATIVE  ETHANOL    EKG: EKG Interpretation Date/Time:  Thursday October 21 2023 12:22:14 EDT Ventricular Rate:  106 PR Interval:  116 QRS Duration:  95 QT Interval:  355 QTC Calculation: 472 R Axis:   72  Text Interpretation: Sinus tachycardia Anterior infarct, old Confirmed by Ula Barter [phone]) on 10/21/2023 12:23:56 PM  Radiology: CT Head Wo Contrast Result Date: 10/21/2023 CLINICAL DATA:  Mental status change, unknown cause EXAM: CT HEAD WITHOUT CONTRAST TECHNIQUE: Contiguous axial images were obtained from the base of the  skull through the vertex without intravenous contrast. RADIATION DOSE REDUCTION: This exam was performed according to the departmental dose-optimization program which includes automated exposure control, adjustment of the mA and/or kV according to patient size and/or use of iterative reconstruction technique. COMPARISON:  Head CT and MRI 09/12/2023 FINDINGS: Brain: Vague area of low-density within the left frontotemporal region, series 2, image 44, corresponds to abnormal signal on prior MRI, and is unchanged from prior head CT. No acute hemorrhage or evidence of acute ischemia. Stable brain volume without hydrocephalus. Minimal periventricular and deep white matter hypodensity typical of chronic small vessel ischemia, stable. No subdural or extra-axial collection. No midline shift. Vascular: No hyperdense vessel or unexpected calcification. Skull: No fracture or focal lesion. Sinuses/Orbits: Paranasal sinuses and mastoid air cells are clear. The visualized orbits are unremarkable. Other: None. IMPRESSION: 1. No acute intracranial abnormality. 2. Vague area of low-density within the left frontotemporal region corresponds to abnormal signal on prior MRI, and is unchanged from prior head CT. 3. Stable mild chronic small vessel ischemia. Electronically Signed   By: Andrea Gasman M.D.   On: 10/21/2023 14:06     Procedures   Medications Ordered in the ED  diphenhydrAMINE (BENADRYL) injection 25 mg (25 mg Intravenous Patient Refused/Not Given 10/21/23 1357)  levETIRAcetam  (KEPPRA ) undiluted injection 1,500 mg (1,500 mg Intravenous Patient Refused/Not Given 10/21/23 1550)  lactated ringers bolus 1,000 mL (1,000 mLs Intravenous New Bag/Given 10/21/23 1249)  prochlorperazine  (COMPAZINE ) injection 10 mg (10 mg Intravenous Given 10/21/23 1431)  ketorolac  (TORADOL ) 15 MG/ML injection 15 mg (15 mg Intravenous Given 10/21/23 1542)  dexamethasone  (DECADRON ) injection 10 mg (10 mg Intravenous Given 10/21/23 1541)                                     Medical Decision Making 48 year old female with past medical history of migraine headaches and seizure in the past presenting to the emergency department today with altered mental status.  I will further evaluate the patient here with basic labs Wels CT scan of her head to evaluate for electrolyte abnormalities.  Will obtain a CT scan of her head to eval for intracranial hemorrhage or mass lesion.  Will obtain an ammonia level to evaluate for hepatic encephalopathy and urinalysis/UDS to eval for UTI or drug use respectively.  Also obtain an EtOH level.  I will reevaluate for ultimate disposition.  This does seem to be relatively similar to previous presentation when the patient had a reassuring workup.  We are waiting on some of the patient's workup.  I did call and discussed her case with Dr. Matthews.  She recommends giving the patient Toradol  and Decadron .  This is given here.  Recommends starting the patient on Keppra  which is ordered.  Signed out to Dr. Patt with remainder of workup pending.   Amount  and/or Complexity of Data Reviewed Labs: ordered. Radiology: ordered.  Risk Prescription drug management.        Final diagnoses:  Altered mental status, unspecified altered mental status type    ED Discharge Orders     None          Ula Prentice SAUNDERS, MD 10/21/23 1557

## 2023-10-21 NOTE — Discharge Instructions (Signed)
 I suspected that you had another seizure.  Our neurologist recommend increase your Topamax  to 200 mg twice daily.  Please discuss this with your primary care doctor  I encourage you to follow-up with neurology outpatient  No driving until you are cleared by neurologist  Return to ER if she has another seizure or lethargy

## 2023-10-21 NOTE — ED Triage Notes (Signed)
 Pt. BIB [facility] with c/o AMS; Was at an interview and person she was interviewing with noticed that she was spaced out; later found her unresponsive in her car; Per [facility] no0 seizure-like activity found; VSS per [facility]; Pt. Is responsive to voice now. Pt. Has a 20G L FA

## 2023-10-21 NOTE — ED Provider Notes (Signed)
  Physical Exam  BP 105/63 (BP Location: Left Arm)   Pulse 74   Temp 98.6 F (37 C) (Oral)   Resp 20   SpO2 98%   Physical Exam  Procedures  Procedures  ED Course / MDM    Medical Decision Making Care assumed at 3 PM.  Patient is here with another seizure.  Patient had admission about a month ago for seizure and had a negative EEG.  Patient' Topamax  was increased to 100 mg in the morning and 150 at night.  She had another seizure today and signed out pending reassessment  5:51 PM Labs showed anion gap of 19 likely from seizure.  CT head unchanged from previous.  I discussed with Dr. Matthews.  She initially recommend Keppra  but family does not want Keppra .  She is already taking Topamax  150 mg twice daily.  Will increase it to 200 mg twice daily.  They state that they will call their primary care doctor tomorrow to discuss this.  She also has neurology follow-up in a month  Amount and/or Complexity of Data Reviewed Labs: ordered. Radiology: ordered.  Risk Prescription drug management.          Patt Alm Macho, MD 10/21/23 [phone]

## 2023-11-17 ENCOUNTER — Ambulatory Visit: Admitting: Neurology

## 2023-12-30 ENCOUNTER — Ambulatory Visit: Admitting: Neurology

## 2023-12-30 ENCOUNTER — Encounter: Payer: Self-pay | Admitting: Neurology

## 2023-12-30 VITALS — BP 117/61 | HR 61 | Ht 61.0 in | Wt 114.0 lb

## 2023-12-30 DIAGNOSIS — G40109 Localization-related (focal) (partial) symptomatic epilepsy and epileptic syndromes with simple partial seizures, not intractable, without status epilepticus: Secondary | ICD-10-CM | POA: Diagnosis not present

## 2023-12-30 DIAGNOSIS — C719 Malignant neoplasm of brain, unspecified: Secondary | ICD-10-CM

## 2023-12-30 NOTE — Progress Notes (Unsigned)
 [facility]  PATIENT: Brittany Rogers DOB: May 04, 1975  REQUESTING CLINICIAN: Ghimire, Donalda CHRISTELLA, MD HISTORY FROM: *** REASON FOR VISIT: ***   HISTORICAL  CHIEF COMPLAINT:  Chief Complaint  Patient presents with   New Patient (Initial Visit)    [address] Husband on the way Internal referral for Seizure-like activity     HISTORY OF PRESENT ILLNESS:  2018, first seizure   Aura: diffuiculty texting  Botox injection in July  August 31 Botox again  Sz in October 9  Handedness:   Onset:  Seizure Type:   Current frequency:   Any injuries from seizures:   Seizure risk factors:   Previous ASMs:   Currenty ASMs:   ASMs side effects:   Brain Images:   Previous EEGs:    OTHER MEDICAL CONDITIONS: ***  REVIEW OF SYSTEMS: Full 14 system review of systems performed and negative with exception of: ***  ALLERGIES: Allergies[1]  HOME MEDICATIONS: Outpatient Medications Prior to Visit  Medication Sig Dispense Refill   diazepam (DIASTAT ACUDIAL) 10 MG GEL Place 10 mg rectally once as needed for seizure.     EMGALITY 120 MG/ML SOAJ Inject 120 mg into the skin every 30 (thirty) days.     LORazepam  (ATIVAN ) 0.5 MG tablet Take 0.5-1 mg by mouth every 6 (six) hours as needed for anxiety (seizure-like activity).     NONFORMULARY OR COMPOUNDED ITEM Compounded T3/T4 thyroid  hormone therapy. Dosage unknown. Prescribed by Dr Mliss Height with [facility] in [facility]     OVER THE COUNTER MEDICATION Take 1 tablet by mouth daily. Caffeine + Vitamin supplement (300mg  of caffeine / tablet, vitamins unknown)     PROGESTERONE PO Take 85 each by mouth once. 12 -28 days     rizatriptan (MAXALT) 10 MG tablet Take 10 mg by mouth as needed for migraine.     topiramate  (TOPAMAX ) 100 MG tablet Take 100 mg in a.m. and 150 mg in p.m.     No facility-administered medications prior to visit.    PAST MEDICAL HISTORY: Past Medical History:  Diagnosis Date   Abdominal  pain    Acute otitis media    Adrenal insufficiency (Addison's disease) (HCC)    Candida vaginitis    Cellulitis of nasal tip    Conjunctivitis    Cough    Depression    Diastasis recti    GAD (generalized anxiety disorder)    Hypothyroid    Irregular heart rate    Migraine    Migraine without aura and responsive to treatment 10/19/2012   Migraines    Migraines    Murmur    Nausea    Nipple dermatitis    Otitis externa    Panic attack    Pharyngitis    Pneumonia due to COVID-19 virus    Sciatica    Sinusitis    Spondylisthesis    Umbilical hernia    UTI (urinary tract infection)    Vomiting     PAST SURGICAL HISTORY: History reviewed. No pertinent surgical history.  FAMILY HISTORY: History reviewed. No pertinent family history.  SOCIAL HISTORY: Social History   Socioeconomic History   Marital status: Married    Spouse name: Not on file   Number of children: Not on file   Years of education: Not on file   Highest education level: Not on file  Occupational History   Not on file  Tobacco Use   Smoking status: Never   Smokeless tobacco: Never  Substance and Sexual Activity  Alcohol use: Yes   Drug use: Not on file   Sexual activity: Yes  Other Topics Concern   Not on file  Social History Narrative   Not on file   Social Drivers of Health   Tobacco Use: Low Risk (12/30/2023)   Patient History    Smoking Tobacco Use: Never    Smokeless Tobacco Use: Never    Passive Exposure: Not on file  Financial Resource Strain: Not on file  Food Insecurity: Low Risk (12/01/2023)   Received from [facility]   Epic    Within the past 12 months, you worried that your food would run out before you got money to buy more: Never true    Within the past 12 months, the food you bought just didn't last and you didn't have money to get more. : Never true  Transportation Needs: No Transportation Needs (12/01/2023)   Received from [facility]    In the  past 12 months, has lack of reliable transportation kept you from medical appointments, meetings, work or from getting things needed for daily living? : No  Physical Activity: Not on file  Stress: Not on file  Social Connections: Not on file  Intimate Partner Violence: Not At Risk (09/13/2023)   Epic    Fear of Current or Ex-Partner: No    Emotionally Abused: No    Physically Abused: No    Sexually Abused: No  Depression (PHQ2-9): Not on file  Alcohol Screen: Not on file  Housing: Low Risk (12/01/2023)   Received from [facility]   Epic    What is your living situation today?: I have a steady place to live    Think about the place you live. Do you have problems with any of the following? Choose all that apply:: None/None on this list  Utilities: Low Risk (12/01/2023)   Received from [facility]   Utilities    In the past 12 months has the electric, gas, oil, or water company threatened to shut off services in your home? : No  Health Literacy: Not on file     PHYSICAL EXAM ***  GENERAL EXAM/CONSTITUTIONAL: Vitals:  Vitals:   12/30/23 0844  BP: 117/61  Pulse: 61  Weight: 114 lb (51.7 kg)  Height: 5' 1 (1.549 m)   Body mass index is 21.54 kg/m. Wt Readings from Last 3 Encounters:  12/30/23 114 lb (51.7 kg)  09/12/23 112 lb (50.8 kg)  03/17/21 110 lb (49.9 kg)   Patient is in no distress; well developed, nourished and groomed; neck is supple  MUSCULOSKELETAL: Gait, strength, tone, movements noted in Neurologic exam below  NEUROLOGIC: MENTAL STATUS:      No data to display         awake, alert, oriented to person, place and time recent and remote memory intact normal attention and concentration language fluent, comprehension intact, naming intact fund of knowledge appropriate  CRANIAL NERVE:  2nd, 3rd, 4th, 6th - Visual fields full to confrontation, extraocular muscles intact, no nystagmus 5th - facial sensation symmetric 7th - facial strength  symmetric 8th - hearing intact 9th - palate elevates symmetrically, uvula midline 11th - shoulder shrug symmetric 12th - tongue protrusion midline  MOTOR:  normal bulk and tone, full strength in the BUE, BLE  SENSORY:  normal and symmetric to light touch  COORDINATION:  finger-nose-finger, fine finger movements normal  GAIT/STATION:  normal     DIAGNOSTIC DATA (LABS, IMAGING, TESTING) - I reviewed patient  records, labs, notes, testing and imaging myself where available.  Lab Results  Component Value Date   WBC 7.0 10/21/2023   HGB 16.2 (H) 10/21/2023   HCT 48.0 (H) 10/21/2023   MCV 93.2 10/21/2023   PLT 217 10/21/2023      Component Value Date/Time   NA 140 10/21/2023 1220   K 3.2 (L) 10/21/2023 1220   CL 108 10/21/2023 1220   CO2 13 (L) 10/21/2023 1220   GLUCOSE 165 (H) 10/21/2023 1220   BUN 24 (H) 10/21/2023 1220   CREATININE 1.19 (H) 10/21/2023 1220   CALCIUM 8.7 (L) 10/21/2023 1220   PROT 7.1 10/21/2023 1220   ALBUMIN 4.1 10/21/2023 1220   AST 32 10/21/2023 1220   ALT 28 10/21/2023 1220   ALKPHOS 55 10/21/2023 1220   BILITOT 0.7 10/21/2023 1220   GFRNONAA 57 (L) 10/21/2023 1220   No results found for: CHOL, HDL, LDLCALC, LDLDIRECT, TRIG No results found for: HGBA1C No results found for: VITAMINB12 Lab Results  Component Value Date   TSH 0.788 09/12/2023    ***  I personally reviewed brain Images and previous EEG reports.   ASSESSMENT AND PLAN  48 y.o. year old female  with ***   No diagnosis found.  There are no Patient Instructions on file for this visit.   Per [employer]  DMV statutes, patients with seizures are not allowed to drive until they have been seizure-free for six months.  Other recommendations include using caution when using heavy equipment or power tools. Avoid working on ladders or at heights. Take showers instead of baths.  Do not swim alone.  Ensure the water temperature is not too high on the home water  heater. Do not go swimming alone. Do not lock yourself in a room alone (i.e. bathroom). When caring for infants or small children, sit down when holding, feeding, or changing them to minimize risk of injury to the child in the event you have a seizure. Maintain good sleep hygiene. Avoid alcohol.  Also recommend adequate sleep, hydration, good diet and minimize stress.   During the Seizure  - First, ensure adequate ventilation and place patients on the floor on their left side  Loosen clothing around the neck and ensure the airway is patent. If the patient is clenching the teeth, do not force the mouth open with any object as this can cause severe damage - Remove all items from the surrounding that can be hazardous. The patient may be oblivious to what's happening and may not even know what he or she is doing. If the patient is confused and wandering, either gently guide him/her away and block access to outside areas - Reassure the individual and be comforting - Call 911. In most cases, the seizure ends before EMS arrives. However, there are cases when seizures may last over 3 to 5 minutes. Or the individual may have developed breathing difficulties or severe injuries. If a pregnant patient or a person with diabetes develops a seizure, it is prudent to call an ambulance. - Finally, if the patient does not regain full consciousness, then call EMS. Most patients will remain confused for about 45 to 90 minutes after a seizure, so you must use judgment in calling for help. - Avoid restraints but make sure the patient is in a bed with padded side rails - Place the individual in a lateral position with the neck slightly flexed; this will help the saliva drain from the mouth and prevent the tongue from falling backward -  Remove all nearby furniture and other hazards from the area - Provide verbal assurance as the individual is regaining consciousness - Provide the patient with privacy if possible - Call for  help and start treatment as ordered by the caregiver   After the Seizure (Postictal Stage)  After a seizure, most patients experience confusion, fatigue, muscle pain and/or a headache. Thus, one should permit the individual to sleep. For the next few days, reassurance is essential. Being calm and helping reorient the person is also of importance.  Most seizures are painless and end spontaneously. Seizures are not harmful to others but can lead to complications such as stress on the lungs, brain and the heart. Individuals with prior lung problems may develop labored breathing and respiratory distress.    Discussed Patients with epilepsy have a small risk of sudden unexpected death, a condition referred to as sudden unexpected death in epilepsy (SUDEP). SUDEP is defined specifically as the sudden, unexpected, witnessed or unwitnessed, nontraumatic and nondrowning death in patients with epilepsy with or without evidence for a seizure, and excluding documented status epilepticus, in which post mortem examination does not reveal a structural or toxicologic cause for death     No orders of the defined types were placed in this encounter.   No orders of the defined types were placed in this encounter.   No follow-ups on file.    Pastor Falling, MD 12/30/2023, 8:57 AM  [facility] [address], [address] [place], [place] [address] [phone]     [1]  Allergies Allergen Reactions   Latex Swelling, Dermatitis and Rash    Reported swelling and irritation after using a condom.

## 2024-01-01 MED ORDER — TOPIRAMATE ER 200 MG PO CAP24: 200.0000 mg | ORAL_CAPSULE | Freq: Two times a day (BID) | ORAL | 3 refills | Status: AC

## 2024-01-01 MED ORDER — TRAZODONE HCL 50 MG PO TABS: 50.0000 mg | ORAL_TABLET | Freq: Every day | ORAL | 0 refills | Status: DC

## 2024-01-01 NOTE — Patient Instructions (Addendum)
 Increase Topiramate  to Topiramate  XR 200 mg twice daily (changes of formulation to minimize side effects)  Use the Diazepam as needed with the seizure aura  Use Nayzilam as soon as the seizure start Referral to Neurosurgery for further management  Referral to Neuro-Oncology for further management  Now, patient was seen by [facility]. I did inform her that moving forward, she will have to decide if she going to continue her epilepsy care with [facility] or with [facility]. She voiced understanding.  Return in 3 months or sooner if worse

## 2024-01-03 ENCOUNTER — Telehealth: Payer: Self-pay | Admitting: Neurology

## 2024-01-03 NOTE — Telephone Encounter (Signed)
 Referral for neurology sent through [facility] to [facility]. Phone: [phone], Fax: [phone]

## 2024-01-03 NOTE — Telephone Encounter (Addendum)
 Referral for neurosurgery sent through [facility] to [facility] to see Dr,. Rashid Janjua. Phone: [phone] Fax: [phone]

## 2024-01-25 ENCOUNTER — Telehealth: Payer: Self-pay | Admitting: Neurology

## 2024-01-25 NOTE — Telephone Encounter (Signed)
"  Mychart msg  "

## 2024-01-26 ENCOUNTER — Other Ambulatory Visit: Payer: Self-pay

## 2024-01-26 ENCOUNTER — Other Ambulatory Visit: Payer: Self-pay | Admitting: Neurology

## 2024-01-26 MED ORDER — TRAZODONE HCL 50 MG PO TABS: 50.0000 mg | ORAL_TABLET | Freq: Every day | ORAL | 0 refills | Status: AC

## 2024-02-09 ENCOUNTER — Telehealth: Payer: Self-pay | Admitting: Neurology

## 2024-02-09 NOTE — Telephone Encounter (Signed)
 Pt husband picked up her discs and pt would like to know what the findings were on the 2 discs they picke up. Please call pt TY
